# Patient Record
Sex: Female | Born: 1980
Health system: Southern US, Community
[De-identification: ages and names within clinical notes are randomized; demographics above are authoritative.]

## PROBLEM LIST (undated history)

## (undated) DIAGNOSIS — M25579 Pain in unspecified ankle and joints of unspecified foot: Secondary | ICD-10-CM

## (undated) DIAGNOSIS — I10 Essential (primary) hypertension: Secondary | ICD-10-CM

## (undated) DIAGNOSIS — F419 Anxiety disorder, unspecified: Secondary | ICD-10-CM

## (undated) DIAGNOSIS — Z889 Allergy status to unspecified drugs, medicaments and biological substances status: Secondary | ICD-10-CM

## (undated) DIAGNOSIS — L709 Acne, unspecified: Secondary | ICD-10-CM

## (undated) HISTORY — DX: Allergy status to unspecified drugs, medicaments and biological substances: Z88.9

## (undated) HISTORY — DX: Acne, unspecified: L70.9

## (undated) HISTORY — DX: Pain in unspecified ankle and joints of unspecified foot: M25.579

## (undated) HISTORY — DX: Anxiety disorder, unspecified: F41.9

## (undated) HISTORY — PX: ANKLE SURGERY: SHX546

---
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
## 2004-10-29 ENCOUNTER — Emergency Department (HOSPITAL_COMMUNITY): Admission: EM | Admit: 2004-10-29 | Discharge: 2004-10-30 | Payer: Self-pay | Admitting: Emergency Medicine

## 2007-04-16 ENCOUNTER — Other Ambulatory Visit: Admission: RE | Admit: 2007-04-16 | Discharge: 2007-04-16 | Payer: Self-pay | Admitting: Family Medicine

## 2008-08-10 ENCOUNTER — Emergency Department (HOSPITAL_COMMUNITY): Admission: EM | Admit: 2008-08-10 | Discharge: 2008-08-10 | Payer: Self-pay | Admitting: Emergency Medicine

## 2008-10-10 ENCOUNTER — Other Ambulatory Visit: Admission: RE | Admit: 2008-10-10 | Discharge: 2008-10-10 | Payer: Self-pay | Admitting: Family Medicine

## 2009-05-12 ENCOUNTER — Encounter: Admission: RE | Admit: 2009-05-12 | Discharge: 2009-05-12 | Payer: Self-pay | Admitting: Family Medicine

## 2010-09-09 LAB — URINE MICROSCOPIC-ADD ON

## 2010-09-09 LAB — URINALYSIS, ROUTINE W REFLEX MICROSCOPIC
Bilirubin Urine: NEGATIVE
Glucose, UA: NEGATIVE mg/dL
Hgb urine dipstick: NEGATIVE
Ketones, ur: NEGATIVE mg/dL
Nitrite: NEGATIVE
Protein, ur: 30 mg/dL — AB
Specific Gravity, Urine: 1.029 (ref 1.005–1.030)
Urobilinogen, UA: 1 mg/dL (ref 0.0–1.0)
pH: 7.5 (ref 5.0–8.0)

## 2010-09-09 LAB — POCT I-STAT, CHEM 8
BUN: 11 mg/dL (ref 6–23)
Calcium, Ion: 1.04 mmol/L — ABNORMAL LOW (ref 1.12–1.32)
Chloride: 106 mEq/L (ref 96–112)
Creatinine, Ser: 1.1 mg/dL (ref 0.4–1.2)
Glucose, Bld: 123 mg/dL — ABNORMAL HIGH (ref 70–99)
HCT: 33 % — ABNORMAL LOW (ref 36.0–46.0)
Hemoglobin: 11.2 g/dL — ABNORMAL LOW (ref 12.0–15.0)
Potassium: 4 mEq/L (ref 3.5–5.1)
Sodium: 138 mEq/L (ref 135–145)
TCO2: 25 mmol/L (ref 0–100)

## 2010-09-09 LAB — POCT PREGNANCY, URINE

## 2010-10-15 NOTE — Op Note (Signed)
[place]. [facility]  Patient:    Becky Tate, Becky Tate                       MRN: [id number] Proc. Date: 03/15/00 Adm. Date:  [id number] Attending:  Colbert Ewing                           Operative Report  PREOPERATIVE DIAGNOSIS:  Mild to moderate instability, left ankle with loose body secondary to osteochondritis desiccans/osteochondral fracture medial talar dome.  POSTOPERATIVE DIAGNOSIS:  Mild to moderate instability, left ankle with loose body secondary to osteochondritis desiccans/osteochondral fracture medial talar dome.  Loose avascular fragment irreparable.  Moderate laxity.  OPERATION PERFORMED:  Left ankle examination under anesthesia with stress views followed by arthroscopy, removal loose body, partial synovectomy and microfracture multiple drilling medial talar dome.  SURGEON:  Loreta Ave, M.D.  ASSISTANT:  Arlys John D. Petrarca, P.A.-C.  ANESTHESIA:  General.  ESTIMATED BLOOD LOSS:  Minimal.  TOURNIQUET TIME:  45 minutes.  SPECIMENS:  Excised loose body.  CULTURES:  None.  COMPLICATIONS:  None.  DRESSING:  Soft compressive with Cam walker.  DESCRIPTION OF PROCEDURE:  The patient was brought to the operating room and placed on the operating table in supine position.  After adequate anesthesia had been obtained, the left ankle had been examined.  Full motion. On stress view and talar tilt view, she opens up about 9 degrees to talar tilt.  The irregularity of the medial talar dome evident, but I did not see a bony loose fragment obvious.  Tourniquet applied, prepped and draped in the usual sterile fashion.  Exsanguinated with elevation and Esmarch.  Tourniquet inflated to 300 mmHg.  Anterolateral anteromedial arthroscopic portals.  Ankle entered with blunt obturator, distended and inspected.  Other than the osteochondral lesion which was 10 x 7 mm off the medial talar dome midportion, the remaining articular cartilage looked  excellent.  Reactive synovitis throughout the ankle all debrided.  The osteochondral lesion was also most completely free with only partial tethering with a sliver of articular cartilage anteriorly.  Not enough bone remaining in the fragment to warrant repair as this was completely avascular and mobile.  The fragment was freed up and removed with a grasping forceps.  All loose bodies debrided.  Uneven articular cartilage debrided to a stable surface.  The base of the lesion which had some mild fibrocartilage ingrowth was freshened with multiple drilling with the microfracturing device to allow vascularization for fibrocartilage ingrowth.  The entire ankle examined.  All loose fragments removed.  Instruments and fluid removed. Portals and ankle were injected with Marcaine. Portals closed with 4-0 nylon. Sterile compressive dressing applied.  Tourniquet deflated and removed. Anesthesia reversed.  Brought to recovery room.  Tolerated surgery well.  No complications. DD:  03/15/00 TD:  03/16/00 Job: [id number]

## 2011-05-25 ENCOUNTER — Other Ambulatory Visit: Payer: Self-pay | Admitting: Physician Assistant

## 2011-05-25 ENCOUNTER — Other Ambulatory Visit (HOSPITAL_COMMUNITY)
Admission: RE | Admit: 2011-05-25 | Discharge: 2011-05-25 | Disposition: A | Discharge status: Home / Self Care | Payer: 59 | Source: Ambulatory Visit | Attending: Family Medicine | Admitting: Family Medicine

## 2011-05-25 DIAGNOSIS — Z124 Encounter for screening for malignant neoplasm of cervix: Secondary | ICD-10-CM | POA: Insufficient documentation

## 2012-12-28 ENCOUNTER — Other Ambulatory Visit: Payer: Self-pay | Admitting: Physician Assistant

## 2012-12-28 ENCOUNTER — Other Ambulatory Visit (HOSPITAL_COMMUNITY)
Admission: RE | Admit: 2012-12-28 | Discharge: 2012-12-28 | Disposition: A | Discharge status: Home / Self Care | Payer: 59 | Source: Ambulatory Visit | Attending: Family Medicine | Admitting: Family Medicine

## 2012-12-28 DIAGNOSIS — Z124 Encounter for screening for malignant neoplasm of cervix: Secondary | ICD-10-CM | POA: Insufficient documentation

## 2014-07-17 ENCOUNTER — Other Ambulatory Visit (HOSPITAL_COMMUNITY)
Admission: RE | Admit: 2014-07-17 | Discharge: 2014-07-17 | Disposition: A | Discharge status: Home / Self Care | Payer: 59 | Source: Ambulatory Visit | Attending: Family Medicine | Admitting: Family Medicine

## 2014-07-17 ENCOUNTER — Other Ambulatory Visit: Payer: Self-pay | Admitting: Physician Assistant

## 2014-07-17 DIAGNOSIS — Z124 Encounter for screening for malignant neoplasm of cervix: Secondary | ICD-10-CM | POA: Diagnosis present

## 2014-07-18 LAB — CYTOLOGY - PAP

## 2015-11-09 DIAGNOSIS — L739 Follicular disorder, unspecified: Secondary | ICD-10-CM | POA: Diagnosis not present

## 2016-06-06 DIAGNOSIS — R42 Dizziness and giddiness: Secondary | ICD-10-CM | POA: Diagnosis not present

## 2016-06-06 DIAGNOSIS — R8290 Unspecified abnormal findings in urine: Secondary | ICD-10-CM | POA: Diagnosis not present

## 2016-06-06 DIAGNOSIS — M7710 Lateral epicondylitis, unspecified elbow: Secondary | ICD-10-CM | POA: Diagnosis not present

## 2016-06-10 DIAGNOSIS — H8123 Vestibular neuronitis, bilateral: Secondary | ICD-10-CM | POA: Diagnosis not present

## 2016-06-10 DIAGNOSIS — J300 Vasomotor rhinitis: Secondary | ICD-10-CM | POA: Diagnosis not present

## 2016-07-25 DIAGNOSIS — M170 Bilateral primary osteoarthritis of knee: Secondary | ICD-10-CM | POA: Diagnosis not present

## 2016-07-25 DIAGNOSIS — M1710 Unilateral primary osteoarthritis, unspecified knee: Secondary | ICD-10-CM

## 2016-07-25 DIAGNOSIS — M93272 Osteochondritis dissecans, left ankle and joints of left foot: Secondary | ICD-10-CM | POA: Diagnosis not present

## 2016-07-25 DIAGNOSIS — M179 Osteoarthritis of knee, unspecified: Secondary | ICD-10-CM | POA: Insufficient documentation

## 2016-07-25 DIAGNOSIS — M9320 Osteochondritis dissecans of unspecified site: Secondary | ICD-10-CM

## 2016-07-25 HISTORY — DX: Osteochondritis dissecans of unspecified site: M93.20

## 2016-07-25 HISTORY — DX: Osteoarthritis of knee, unspecified: M17.9

## 2016-07-25 HISTORY — DX: Unilateral primary osteoarthritis, unspecified knee: M17.10

## 2016-08-03 DIAGNOSIS — Z0000 Encounter for general adult medical examination without abnormal findings: Secondary | ICD-10-CM | POA: Diagnosis not present

## 2016-08-03 DIAGNOSIS — J3089 Other allergic rhinitis: Secondary | ICD-10-CM | POA: Diagnosis not present

## 2016-11-03 DIAGNOSIS — L638 Other alopecia areata: Secondary | ICD-10-CM | POA: Diagnosis not present

## 2016-11-03 DIAGNOSIS — L700 Acne vulgaris: Secondary | ICD-10-CM | POA: Diagnosis not present

## 2016-11-03 DIAGNOSIS — L218 Other seborrheic dermatitis: Secondary | ICD-10-CM | POA: Diagnosis not present

## 2016-12-08 DIAGNOSIS — F3289 Other specified depressive episodes: Secondary | ICD-10-CM | POA: Diagnosis not present

## 2016-12-16 DIAGNOSIS — G4719 Other hypersomnia: Secondary | ICD-10-CM | POA: Diagnosis not present

## 2016-12-16 DIAGNOSIS — R0683 Snoring: Secondary | ICD-10-CM | POA: Diagnosis not present

## 2016-12-20 DIAGNOSIS — F3289 Other specified depressive episodes: Secondary | ICD-10-CM | POA: Diagnosis not present

## 2016-12-29 DIAGNOSIS — J3489 Other specified disorders of nose and nasal sinuses: Secondary | ICD-10-CM | POA: Diagnosis not present

## 2017-01-04 DIAGNOSIS — F3289 Other specified depressive episodes: Secondary | ICD-10-CM | POA: Diagnosis not present

## 2017-01-08 DIAGNOSIS — G4733 Obstructive sleep apnea (adult) (pediatric): Secondary | ICD-10-CM | POA: Diagnosis not present

## 2017-01-09 DIAGNOSIS — G4733 Obstructive sleep apnea (adult) (pediatric): Secondary | ICD-10-CM | POA: Diagnosis not present

## 2017-01-20 DIAGNOSIS — G4733 Obstructive sleep apnea (adult) (pediatric): Secondary | ICD-10-CM | POA: Diagnosis not present

## 2017-02-06 DIAGNOSIS — F3289 Other specified depressive episodes: Secondary | ICD-10-CM | POA: Diagnosis not present

## 2017-02-20 DIAGNOSIS — G4733 Obstructive sleep apnea (adult) (pediatric): Secondary | ICD-10-CM | POA: Diagnosis not present

## 2017-03-02 DIAGNOSIS — B070 Plantar wart: Secondary | ICD-10-CM | POA: Diagnosis not present

## 2017-03-02 DIAGNOSIS — M71572 Other bursitis, not elsewhere classified, left ankle and foot: Secondary | ICD-10-CM | POA: Diagnosis not present

## 2017-03-08 DIAGNOSIS — F3289 Other specified depressive episodes: Secondary | ICD-10-CM | POA: Diagnosis not present

## 2017-03-16 DIAGNOSIS — I890 Lymphedema, not elsewhere classified: Secondary | ICD-10-CM | POA: Diagnosis not present

## 2017-03-16 DIAGNOSIS — B070 Plantar wart: Secondary | ICD-10-CM | POA: Diagnosis not present

## 2017-03-20 DIAGNOSIS — G4733 Obstructive sleep apnea (adult) (pediatric): Secondary | ICD-10-CM | POA: Diagnosis not present

## 2017-03-22 DIAGNOSIS — G4733 Obstructive sleep apnea (adult) (pediatric): Secondary | ICD-10-CM | POA: Diagnosis not present

## 2017-04-10 DIAGNOSIS — G4733 Obstructive sleep apnea (adult) (pediatric): Secondary | ICD-10-CM | POA: Diagnosis not present

## 2017-04-13 DIAGNOSIS — I890 Lymphedema, not elsewhere classified: Secondary | ICD-10-CM | POA: Diagnosis not present

## 2017-04-13 DIAGNOSIS — B070 Plantar wart: Secondary | ICD-10-CM | POA: Diagnosis not present

## 2017-04-15 DIAGNOSIS — J0100 Acute maxillary sinusitis, unspecified: Secondary | ICD-10-CM | POA: Diagnosis not present

## 2017-04-15 DIAGNOSIS — R42 Dizziness and giddiness: Secondary | ICD-10-CM | POA: Diagnosis not present

## 2017-04-15 DIAGNOSIS — I10 Essential (primary) hypertension: Secondary | ICD-10-CM | POA: Diagnosis not present

## 2017-04-17 DIAGNOSIS — F3289 Other specified depressive episodes: Secondary | ICD-10-CM | POA: Diagnosis not present

## 2017-04-22 DIAGNOSIS — G4733 Obstructive sleep apnea (adult) (pediatric): Secondary | ICD-10-CM | POA: Diagnosis not present

## 2017-04-24 DIAGNOSIS — R51 Headache: Secondary | ICD-10-CM | POA: Diagnosis not present

## 2017-05-17 DIAGNOSIS — F3289 Other specified depressive episodes: Secondary | ICD-10-CM | POA: Diagnosis not present

## 2017-05-22 DIAGNOSIS — G4733 Obstructive sleep apnea (adult) (pediatric): Secondary | ICD-10-CM | POA: Diagnosis not present

## 2017-05-24 DIAGNOSIS — G4733 Obstructive sleep apnea (adult) (pediatric): Secondary | ICD-10-CM | POA: Diagnosis not present

## 2017-06-09 DIAGNOSIS — R062 Wheezing: Secondary | ICD-10-CM | POA: Diagnosis not present

## 2017-06-09 DIAGNOSIS — R0981 Nasal congestion: Secondary | ICD-10-CM | POA: Diagnosis not present

## 2017-06-09 DIAGNOSIS — M25519 Pain in unspecified shoulder: Secondary | ICD-10-CM | POA: Diagnosis not present

## 2017-06-16 DIAGNOSIS — M25511 Pain in right shoulder: Secondary | ICD-10-CM | POA: Diagnosis not present

## 2017-06-16 DIAGNOSIS — F3289 Other specified depressive episodes: Secondary | ICD-10-CM | POA: Diagnosis not present

## 2017-06-22 DIAGNOSIS — G4733 Obstructive sleep apnea (adult) (pediatric): Secondary | ICD-10-CM | POA: Diagnosis not present

## 2017-07-13 DIAGNOSIS — F3289 Other specified depressive episodes: Secondary | ICD-10-CM | POA: Diagnosis not present

## 2017-07-23 DIAGNOSIS — G4733 Obstructive sleep apnea (adult) (pediatric): Secondary | ICD-10-CM | POA: Diagnosis not present

## 2017-07-31 DIAGNOSIS — F4321 Adjustment disorder with depressed mood: Secondary | ICD-10-CM | POA: Diagnosis not present

## 2017-08-02 DIAGNOSIS — F3289 Other specified depressive episodes: Secondary | ICD-10-CM | POA: Diagnosis not present

## 2017-08-16 DIAGNOSIS — F3289 Other specified depressive episodes: Secondary | ICD-10-CM | POA: Diagnosis not present

## 2017-08-20 DIAGNOSIS — G4733 Obstructive sleep apnea (adult) (pediatric): Secondary | ICD-10-CM | POA: Diagnosis not present

## 2017-08-24 DIAGNOSIS — Z13220 Encounter for screening for lipoid disorders: Secondary | ICD-10-CM | POA: Diagnosis not present

## 2017-08-24 DIAGNOSIS — Z0000 Encounter for general adult medical examination without abnormal findings: Secondary | ICD-10-CM | POA: Diagnosis not present

## 2017-08-24 DIAGNOSIS — F4321 Adjustment disorder with depressed mood: Secondary | ICD-10-CM | POA: Diagnosis not present

## 2017-08-24 DIAGNOSIS — R1110 Vomiting, unspecified: Secondary | ICD-10-CM | POA: Diagnosis not present

## 2017-08-24 DIAGNOSIS — F410 Panic disorder [episodic paroxysmal anxiety] without agoraphobia: Secondary | ICD-10-CM | POA: Diagnosis not present

## 2017-08-24 DIAGNOSIS — F430 Acute stress reaction: Secondary | ICD-10-CM | POA: Diagnosis not present

## 2017-09-11 DIAGNOSIS — K219 Gastro-esophageal reflux disease without esophagitis: Secondary | ICD-10-CM | POA: Diagnosis not present

## 2017-09-19 DIAGNOSIS — F3289 Other specified depressive episodes: Secondary | ICD-10-CM | POA: Diagnosis not present

## 2017-09-27 DIAGNOSIS — F430 Acute stress reaction: Secondary | ICD-10-CM | POA: Diagnosis not present

## 2017-09-27 DIAGNOSIS — F4321 Adjustment disorder with depressed mood: Secondary | ICD-10-CM | POA: Diagnosis not present

## 2017-10-03 DIAGNOSIS — R0789 Other chest pain: Secondary | ICD-10-CM | POA: Diagnosis not present

## 2017-10-03 DIAGNOSIS — R002 Palpitations: Secondary | ICD-10-CM | POA: Diagnosis not present

## 2017-10-03 DIAGNOSIS — F419 Anxiety disorder, unspecified: Secondary | ICD-10-CM | POA: Diagnosis not present

## 2017-10-11 DIAGNOSIS — F3289 Other specified depressive episodes: Secondary | ICD-10-CM | POA: Diagnosis not present

## 2017-10-24 ENCOUNTER — Ambulatory Visit: Payer: BLUE CROSS/BLUE SHIELD | Admitting: Cardiology

## 2017-10-24 ENCOUNTER — Encounter: Payer: Self-pay | Admitting: Cardiology

## 2017-10-24 DIAGNOSIS — R0789 Other chest pain: Secondary | ICD-10-CM | POA: Diagnosis not present

## 2017-10-24 DIAGNOSIS — H6980 Other specified disorders of Eustachian tube, unspecified ear: Secondary | ICD-10-CM | POA: Diagnosis not present

## 2017-10-24 DIAGNOSIS — R079 Chest pain, unspecified: Secondary | ICD-10-CM

## 2017-10-24 DIAGNOSIS — M26602 Left temporomandibular joint disorder, unspecified: Secondary | ICD-10-CM | POA: Diagnosis not present

## 2017-10-24 HISTORY — DX: Chest pain, unspecified: R07.9

## 2017-10-24 NOTE — Addendum Note (Signed)
Addended by: Craige Cotta on: 10/24/2017 03:47 PM   Modules accepted: Orders

## 2017-10-24 NOTE — Progress Notes (Signed)
Cardiology Office Note:    Date:  10/24/2017   ID:  Becky Tate, DOB 1981/02/09, MRN [id number]  PCP:  Milus Height, PA-C  Cardiologist:  Garwin Brothers, MD   Referring MD: No ref. provider found    ASSESSMENT:    1. Chest pain of uncertain etiology    PLAN:    In order of problems listed above:  1. I discussed my findings with the patient at extensive length.  I reassured about my findings.  Her chest pain is atypical for coronary etiology.  She hardly has any risk factors for coronary artery disease.  In view of her symptoms I will do a exercise treadmill stress test to reassure her.  Her blood pressure is stable.  Echocardiogram will be done to assess murmur heard on auscultation.  If these tests are negative I have asked her to embark on a fitness program which will help her diet better and lose weight also.  I think a portion of her symptoms are related to the grieving process at the loss of her dear one. 2. Patient will be seen in follow-up appointment in 3 months or earlier if the patient has any concerns    Medication Adjustments/Labs and Tests Ordered: Current medicines are reviewed at length with the patient today.  Concerns regarding medicines are outlined above.  No orders of the defined types were placed in this encounter.  No orders of the defined types were placed in this encounter.    History of Present Illness:    Becky Tate is a 37 y.o. female who is being seen today for the evaluation of chest pain.  Patient is a pleasant 37 year old female.  She is grieving from the loss of her mother.  This happened in February.  She mentions to me that she occasionally has chest pain stabbing in nature radiating to the left arm.  No orthopnea or PND.  With activities of daily living this chest pain is not reproduced.  She does not exercise on a regular basis.  No syncope.  At the time of my evaluation, the patient is alert awake oriented and in no distress.  Past  Medical History:  Diagnosis Date  . Acne   . Ankle pain   . Anxiety   . H/O seasonal allergies     Past Surgical History:  Procedure Laterality Date  . ANKLE SURGERY      Current Medications: Current Meds  Medication Sig  . albuterol (PROVENTIL HFA;VENTOLIN HFA) 108 (90 Base) MCG/ACT inhaler Inhale 2 puffs into the lungs every 4 (four) hours as needed.  Marland Kitchen buPROPion (WELLBUTRIN XL) 150 MG 24 hr tablet Take 1 tablet by mouth daily.  . fexofenadine (ALLEGRA) 180 MG tablet Take 180 mg by mouth daily.  . fluticasone (FLONASE) 50 MCG/ACT nasal spray Place 1 spray into the nose daily.  Marland Kitchen LORazepam (ATIVAN) 1 MG tablet Take 1 tablet by mouth as needed for anxiety. Can also take 1/2 tablet  . omeprazole (PRILOSEC) 40 MG capsule Take 40 mg by mouth daily.  . sertraline (ZOLOFT) 100 MG tablet Take 100 mg by mouth daily.  . [DISCONTINUED] hyoscyamine (LEVSIN, ANASPAZ) 0.125 MG tablet Take 1 tablet by mouth every 6 (six) hours as needed for cramping. Can take 1 to 2 tablets     Allergies:   Oxycodone-acetaminophen and Sulfa antibiotics   Social History   Socioeconomic History  . Marital status: Single    Spouse name: Not on file  . Number of children:  Not on file  . Years of education: Not on file  . Highest education level: Not on file  Occupational History  . Not on file  Social Needs  . Financial resource strain: Not on file  . Food insecurity:    Worry: Not on file    Inability: Not on file  . Transportation needs:    Medical: Not on file    Non-medical: Not on file  Tobacco Use  . Smoking status: Never Smoker  . Smokeless tobacco: Never Used  Substance and Sexual Activity  . Alcohol use: Not on file  . Drug use: Not on file  . Sexual activity: Not on file  Lifestyle  . Physical activity:    Days per week: Not on file    Minutes per session: Not on file  . Stress: Not on file  Relationships  . Social connections:    Talks on phone: Not on file    Gets together: Not  on file    Attends religious service: Not on file    Active member of club or organization: Not on file    Attends meetings of clubs or organizations: Not on file    Relationship status: Not on file  Other Topics Concern  . Not on file  Social History Narrative  . Not on file     Family History: The patient's family history includes Hypertension in her mother.  ROS:   Please see the history of present illness.    All other systems reviewed and are negative.  EKGs/Labs/Other Studies Reviewed:    The following studies were reviewed today: EKG reveals sinus rhythm and nonspecific ST-T changes next   Recent Labs: No results found for requested labs within last 8760 hours.  Recent Lipid Panel No results found for: CHOL, TRIG, HDL, CHOLHDL, VLDL, LDLCALC, LDLDIRECT  Physical Exam:    VS:  BP 126/82 (BP Location: Left Arm, Patient Position: Sitting, Cuff Size: Normal)   Pulse (!) 101   Ht  (1.702 m)   Wt 267 lb 1.9 oz (121.2 kg)   SpO2 98%   BMI 41.84 kg/m     Wt Readings from Last 3 Encounters:  10/24/17 267 lb 1.9 oz (121.2 kg)     GEN: Patient is in no acute distress HEENT: Normal NECK: No JVD; No carotid bruits LYMPHATICS: No lymphadenopathy CARDIAC: S1 S2 regular, 2/6 systolic murmur at the apex. RESPIRATORY:  Clear to auscultation without rales, wheezing or rhonchi  ABDOMEN: Soft, non-tender, non-distended MUSCULOSKELETAL:  No edema; No deformity  SKIN: Warm and dry NEUROLOGIC:  Alert and oriented x 3 PSYCHIATRIC:  Normal affect    Signed, Garwin Brothers, MD  10/24/2017 3:39 PM    [facility]

## 2017-10-24 NOTE — Patient Instructions (Signed)
Medication Instructions:  Your physician recommends that you continue on your current medications as directed. Please refer to the Current Medication list given to you today.  Labwork: None  Testing/Procedures: Your physician has requested that you have an exercise tolerance test. For further information please visit [email or web address] Please also follow instruction sheet, as given.  Your physician has requested that you have an echocardiogram. Echocardiography is a painless test that uses sound waves to create images of your heart. It provides your doctor with information about the size and shape of your heart and how well your heart's chambers and valves are working. This procedure takes approximately one hour. There are no restrictions for this procedure.  Follow-Up: Your physician recommends that you schedule a follow-up appointment in: 3 months  Any Other Special Instructions Will Be Listed Below (If Applicable).     If you need a refill on your cardiac medications before your next appointment, please call your pharmacy.   [facility]  Garey Ham, RN, BSN  Echocardiogram An echocardiogram, or echocardiography, uses sound waves (ultrasound) to produce an image of your heart. The echocardiogram is simple, painless, obtained within a short period of time, and offers valuable information to your health care provider. The images from an echocardiogram can provide information such as:  Evidence of coronary artery disease (CAD).  Heart size.  Heart muscle function.  Heart valve function.  Aneurysm detection.  Evidence of a past heart attack.  Fluid buildup around the heart.  Heart muscle thickening.  Assess heart valve function.  Tell a health care provider about:  Any allergies you have.  All medicines you are taking, including vitamins, herbs, eye drops, creams, and over-the-counter medicines.  Any problems you or family members have had with anesthetic  medicines.  Any blood disorders you have.  Any surgeries you have had.  Any medical conditions you have.  Whether you are pregnant or may be pregnant. What happens before the procedure? No special preparation is needed. Eat and drink normally. What happens during the procedure?  In order to produce an image of your heart, gel will be applied to your chest and a wand-like tool (transducer) will be moved over your chest. The gel will help transmit the sound waves from the transducer. The sound waves will harmlessly bounce off your heart to allow the heart images to be captured in real-time motion. These images will then be recorded.  You may need an IV to receive a medicine that improves the quality of the pictures. What happens after the procedure? You may return to your normal schedule including diet, activities, and medicines, unless your health care provider tells you otherwise. This information is not intended to replace advice given to you by your health care provider. Make sure you discuss any questions you have with your health care provider. Document Released: 05/13/2000 Document Revised: 01/02/2016 Document Reviewed: 01/21/2013 Elsevier Interactive Patient Education  2017 Elsevier Inc.  Cardiopulmonary Exercise Stress Test Cardiopulmonary exercise testing (CPET) is a test that checks how your heart and lungs react to exercise. This is called your exercise capacity. During this test, you will walk or run on a treadmill or pedal on a stationary bike while tests are done on your heart and lungs. You may have this test to:  See why you are short of breath.  Check for exercise intolerance.  See how your lungs work.  See how your heart works.  Check for how you are responding to a heart or lung  rehabilitation program.  See if you have a heart or lung problem.  See if you are healthy enough to have surgery.  What happens before the procedure?  Follow instructions from your  doctor about what you cannot eat or drink.  Ask your doctor about changing or stopping your normal medicines. This is important if you take diabetes medicines or blood thinners.  Wear loose, comfortable clothing and shoes.  If you use an inhaler, bring it with you to the test. What happens during the procedure?  A blood pressure cuff will be placed on your arm.  Several stick-on patches (electrodes) will be placed on your chest. They will be attached to an electrocardiogram (EKG) machine.  A clip-on monitor that measures the amount of oxygen in your blood will be placed on your finger (pulse oximeter).  A clip will be placed on your nose and a mouthpiece will be placed in your mouth. This may be held in place with a headpiece. You will breathe through the mouthpiece during the test.  You will be asked to start exercising. You will be closely watched while you exercise.  The amount of effort for your exercise will be gradually increased.  During exercise, the test will measure: ? Your heart rate. ? Your heart rhythm. ? Your oxygen blood level. ? The amount of oxygen and carbon dioxide that you breathe out.  The test will end when: ? You have finished the test. ? You have reached your maximum ability to exercise. ? You have chest or leg pain, dizziness, or shortness of breath. The procedure may vary among doctors and hospitals. What happens after the procedure?  Your blood pressure and EKG will be checked to watch how you recover from the test. This information is not intended to replace advice given to you by your health care provider. Make sure you discuss any questions you have with your health care provider. Document Released: 05/04/2009 Document Revised: 10/06/2015 Document Reviewed: 03/30/2015 Elsevier Interactive Patient Education  2018 [employer]

## 2017-10-31 ENCOUNTER — Telehealth: Payer: Self-pay

## 2017-10-31 ENCOUNTER — Other Ambulatory Visit: Payer: Self-pay

## 2017-10-31 ENCOUNTER — Ambulatory Visit (INDEPENDENT_AMBULATORY_CARE_PROVIDER_SITE_OTHER): Payer: BLUE CROSS/BLUE SHIELD

## 2017-10-31 ENCOUNTER — Ambulatory Visit (HOSPITAL_COMMUNITY): Payer: BLUE CROSS/BLUE SHIELD | Attending: Cardiology

## 2017-10-31 DIAGNOSIS — F430 Acute stress reaction: Secondary | ICD-10-CM | POA: Diagnosis not present

## 2017-10-31 DIAGNOSIS — R0789 Other chest pain: Secondary | ICD-10-CM

## 2017-10-31 DIAGNOSIS — F4321 Adjustment disorder with depressed mood: Secondary | ICD-10-CM | POA: Diagnosis not present

## 2017-10-31 DIAGNOSIS — I517 Cardiomegaly: Secondary | ICD-10-CM | POA: Insufficient documentation

## 2017-10-31 DIAGNOSIS — R079 Chest pain, unspecified: Secondary | ICD-10-CM

## 2017-10-31 DIAGNOSIS — F3289 Other specified depressive episodes: Secondary | ICD-10-CM | POA: Diagnosis not present

## 2017-10-31 DIAGNOSIS — J3489 Other specified disorders of nose and nasal sinuses: Secondary | ICD-10-CM | POA: Diagnosis not present

## 2017-10-31 NOTE — Telephone Encounter (Signed)
Patient was called and notified of her results.

## 2017-11-01 LAB — EXERCISE TOLERANCE TEST
Estimated workload: 7.9 METS
Exercise duration (min): 6 min
Exercise duration (sec): 36 s
MPHR: 184 {beats}/min
Peak HR: 176 {beats}/min
Percent HR: 95 %
RPE: 17
Rest HR: 97 {beats}/min

## 2017-11-08 DIAGNOSIS — F3289 Other specified depressive episodes: Secondary | ICD-10-CM | POA: Diagnosis not present

## 2017-11-22 DIAGNOSIS — G4733 Obstructive sleep apnea (adult) (pediatric): Secondary | ICD-10-CM | POA: Diagnosis not present

## 2017-12-07 DIAGNOSIS — F3289 Other specified depressive episodes: Secondary | ICD-10-CM | POA: Diagnosis not present

## 2017-12-12 DIAGNOSIS — N62 Hypertrophy of breast: Secondary | ICD-10-CM | POA: Diagnosis not present

## 2017-12-12 DIAGNOSIS — N649 Disorder of breast, unspecified: Secondary | ICD-10-CM | POA: Diagnosis not present

## 2017-12-25 DIAGNOSIS — G479 Sleep disorder, unspecified: Secondary | ICD-10-CM | POA: Diagnosis not present

## 2017-12-25 DIAGNOSIS — M25511 Pain in right shoulder: Secondary | ICD-10-CM | POA: Diagnosis not present

## 2017-12-25 DIAGNOSIS — L819 Disorder of pigmentation, unspecified: Secondary | ICD-10-CM | POA: Diagnosis not present

## 2017-12-25 DIAGNOSIS — M542 Cervicalgia: Secondary | ICD-10-CM | POA: Diagnosis not present

## 2017-12-28 DIAGNOSIS — F3289 Other specified depressive episodes: Secondary | ICD-10-CM | POA: Diagnosis not present

## 2018-01-10 DIAGNOSIS — N62 Hypertrophy of breast: Secondary | ICD-10-CM | POA: Diagnosis not present

## 2018-01-10 DIAGNOSIS — M542 Cervicalgia: Secondary | ICD-10-CM | POA: Diagnosis not present

## 2018-01-10 DIAGNOSIS — M546 Pain in thoracic spine: Secondary | ICD-10-CM | POA: Diagnosis not present

## 2018-01-11 DIAGNOSIS — J329 Chronic sinusitis, unspecified: Secondary | ICD-10-CM | POA: Diagnosis not present

## 2018-01-11 DIAGNOSIS — M25519 Pain in unspecified shoulder: Secondary | ICD-10-CM | POA: Diagnosis not present

## 2018-01-11 DIAGNOSIS — M542 Cervicalgia: Secondary | ICD-10-CM | POA: Diagnosis not present

## 2018-01-17 DIAGNOSIS — M9901 Segmental and somatic dysfunction of cervical region: Secondary | ICD-10-CM | POA: Diagnosis not present

## 2018-01-17 DIAGNOSIS — M40292 Other kyphosis, cervical region: Secondary | ICD-10-CM | POA: Diagnosis not present

## 2018-01-17 DIAGNOSIS — R293 Abnormal posture: Secondary | ICD-10-CM | POA: Diagnosis not present

## 2018-01-17 DIAGNOSIS — M21752 Unequal limb length (acquired), left femur: Secondary | ICD-10-CM | POA: Diagnosis not present

## 2018-01-19 DIAGNOSIS — M545 Low back pain: Secondary | ICD-10-CM | POA: Diagnosis not present

## 2018-01-19 DIAGNOSIS — M542 Cervicalgia: Secondary | ICD-10-CM | POA: Diagnosis not present

## 2018-01-22 DIAGNOSIS — M21752 Unequal limb length (acquired), left femur: Secondary | ICD-10-CM | POA: Diagnosis not present

## 2018-01-22 DIAGNOSIS — R293 Abnormal posture: Secondary | ICD-10-CM | POA: Diagnosis not present

## 2018-01-22 DIAGNOSIS — M40292 Other kyphosis, cervical region: Secondary | ICD-10-CM | POA: Diagnosis not present

## 2018-01-22 DIAGNOSIS — M9901 Segmental and somatic dysfunction of cervical region: Secondary | ICD-10-CM | POA: Diagnosis not present

## 2018-01-24 DIAGNOSIS — M21752 Unequal limb length (acquired), left femur: Secondary | ICD-10-CM | POA: Diagnosis not present

## 2018-01-24 DIAGNOSIS — R293 Abnormal posture: Secondary | ICD-10-CM | POA: Diagnosis not present

## 2018-01-24 DIAGNOSIS — M40292 Other kyphosis, cervical region: Secondary | ICD-10-CM | POA: Diagnosis not present

## 2018-01-24 DIAGNOSIS — M9901 Segmental and somatic dysfunction of cervical region: Secondary | ICD-10-CM | POA: Diagnosis not present

## 2018-01-24 DIAGNOSIS — F3289 Other specified depressive episodes: Secondary | ICD-10-CM | POA: Diagnosis not present

## 2018-01-26 DIAGNOSIS — M545 Low back pain: Secondary | ICD-10-CM | POA: Diagnosis not present

## 2018-01-26 DIAGNOSIS — M542 Cervicalgia: Secondary | ICD-10-CM | POA: Diagnosis not present

## 2018-02-01 DIAGNOSIS — M9901 Segmental and somatic dysfunction of cervical region: Secondary | ICD-10-CM | POA: Diagnosis not present

## 2018-02-01 DIAGNOSIS — R293 Abnormal posture: Secondary | ICD-10-CM | POA: Diagnosis not present

## 2018-02-01 DIAGNOSIS — M21752 Unequal limb length (acquired), left femur: Secondary | ICD-10-CM | POA: Diagnosis not present

## 2018-02-01 DIAGNOSIS — M40292 Other kyphosis, cervical region: Secondary | ICD-10-CM | POA: Diagnosis not present

## 2018-02-07 DIAGNOSIS — F419 Anxiety disorder, unspecified: Secondary | ICD-10-CM | POA: Diagnosis not present

## 2018-02-07 DIAGNOSIS — F4321 Adjustment disorder with depressed mood: Secondary | ICD-10-CM | POA: Diagnosis not present

## 2018-02-08 DIAGNOSIS — M545 Low back pain: Secondary | ICD-10-CM | POA: Diagnosis not present

## 2018-02-08 DIAGNOSIS — M542 Cervicalgia: Secondary | ICD-10-CM | POA: Diagnosis not present

## 2018-02-12 DIAGNOSIS — R293 Abnormal posture: Secondary | ICD-10-CM | POA: Diagnosis not present

## 2018-02-12 DIAGNOSIS — M9901 Segmental and somatic dysfunction of cervical region: Secondary | ICD-10-CM | POA: Diagnosis not present

## 2018-02-12 DIAGNOSIS — M40292 Other kyphosis, cervical region: Secondary | ICD-10-CM | POA: Diagnosis not present

## 2018-02-12 DIAGNOSIS — M21752 Unequal limb length (acquired), left femur: Secondary | ICD-10-CM | POA: Diagnosis not present

## 2018-02-14 DIAGNOSIS — M9901 Segmental and somatic dysfunction of cervical region: Secondary | ICD-10-CM | POA: Diagnosis not present

## 2018-02-14 DIAGNOSIS — R293 Abnormal posture: Secondary | ICD-10-CM | POA: Diagnosis not present

## 2018-02-14 DIAGNOSIS — M542 Cervicalgia: Secondary | ICD-10-CM | POA: Diagnosis not present

## 2018-02-14 DIAGNOSIS — M40292 Other kyphosis, cervical region: Secondary | ICD-10-CM | POA: Diagnosis not present

## 2018-02-14 DIAGNOSIS — M21752 Unequal limb length (acquired), left femur: Secondary | ICD-10-CM | POA: Diagnosis not present

## 2018-02-14 DIAGNOSIS — M545 Low back pain: Secondary | ICD-10-CM | POA: Diagnosis not present

## 2018-02-15 DIAGNOSIS — N62 Hypertrophy of breast: Secondary | ICD-10-CM | POA: Diagnosis not present

## 2018-02-21 DIAGNOSIS — M21752 Unequal limb length (acquired), left femur: Secondary | ICD-10-CM | POA: Diagnosis not present

## 2018-02-21 DIAGNOSIS — F3289 Other specified depressive episodes: Secondary | ICD-10-CM | POA: Diagnosis not present

## 2018-02-21 DIAGNOSIS — R293 Abnormal posture: Secondary | ICD-10-CM | POA: Diagnosis not present

## 2018-02-21 DIAGNOSIS — M9901 Segmental and somatic dysfunction of cervical region: Secondary | ICD-10-CM | POA: Diagnosis not present

## 2018-02-21 DIAGNOSIS — M40292 Other kyphosis, cervical region: Secondary | ICD-10-CM | POA: Diagnosis not present

## 2018-02-23 DIAGNOSIS — R293 Abnormal posture: Secondary | ICD-10-CM | POA: Diagnosis not present

## 2018-02-23 DIAGNOSIS — M9901 Segmental and somatic dysfunction of cervical region: Secondary | ICD-10-CM | POA: Diagnosis not present

## 2018-02-23 DIAGNOSIS — M40292 Other kyphosis, cervical region: Secondary | ICD-10-CM | POA: Diagnosis not present

## 2018-02-23 DIAGNOSIS — M21752 Unequal limb length (acquired), left femur: Secondary | ICD-10-CM | POA: Diagnosis not present

## 2018-03-02 DIAGNOSIS — M40292 Other kyphosis, cervical region: Secondary | ICD-10-CM | POA: Diagnosis not present

## 2018-03-02 DIAGNOSIS — R293 Abnormal posture: Secondary | ICD-10-CM | POA: Diagnosis not present

## 2018-03-02 DIAGNOSIS — M21752 Unequal limb length (acquired), left femur: Secondary | ICD-10-CM | POA: Diagnosis not present

## 2018-03-02 DIAGNOSIS — M9901 Segmental and somatic dysfunction of cervical region: Secondary | ICD-10-CM | POA: Diagnosis not present

## 2018-03-08 DIAGNOSIS — R635 Abnormal weight gain: Secondary | ICD-10-CM | POA: Diagnosis not present

## 2018-03-12 DIAGNOSIS — F3289 Other specified depressive episodes: Secondary | ICD-10-CM | POA: Diagnosis not present

## 2018-03-15 DIAGNOSIS — G4733 Obstructive sleep apnea (adult) (pediatric): Secondary | ICD-10-CM | POA: Diagnosis not present

## 2018-03-15 DIAGNOSIS — E8881 Metabolic syndrome: Secondary | ICD-10-CM | POA: Diagnosis not present

## 2018-03-15 DIAGNOSIS — Z6841 Body Mass Index (BMI) 40.0 and over, adult: Secondary | ICD-10-CM | POA: Diagnosis not present

## 2018-03-16 DIAGNOSIS — Z6838 Body mass index (BMI) 38.0-38.9, adult: Secondary | ICD-10-CM

## 2018-03-16 DIAGNOSIS — E781 Pure hyperglyceridemia: Secondary | ICD-10-CM | POA: Insufficient documentation

## 2018-03-16 DIAGNOSIS — E8881 Metabolic syndrome: Secondary | ICD-10-CM

## 2018-03-16 HISTORY — DX: Body mass index (BMI) 38.0-38.9, adult: Z68.38

## 2018-03-16 HISTORY — DX: Pure hyperglyceridemia: E78.1

## 2018-03-16 HISTORY — DX: Metabolic syndrome: E88.81

## 2018-04-09 DIAGNOSIS — Z713 Dietary counseling and surveillance: Secondary | ICD-10-CM | POA: Diagnosis not present

## 2018-04-09 DIAGNOSIS — Z6841 Body Mass Index (BMI) 40.0 and over, adult: Secondary | ICD-10-CM | POA: Diagnosis not present

## 2018-04-11 DIAGNOSIS — G4733 Obstructive sleep apnea (adult) (pediatric): Secondary | ICD-10-CM | POA: Diagnosis not present

## 2018-04-11 DIAGNOSIS — R030 Elevated blood-pressure reading, without diagnosis of hypertension: Secondary | ICD-10-CM | POA: Diagnosis not present

## 2018-04-12 DIAGNOSIS — J300 Vasomotor rhinitis: Secondary | ICD-10-CM | POA: Diagnosis not present

## 2018-04-12 DIAGNOSIS — J329 Chronic sinusitis, unspecified: Secondary | ICD-10-CM | POA: Diagnosis not present

## 2018-04-12 DIAGNOSIS — F3289 Other specified depressive episodes: Secondary | ICD-10-CM | POA: Diagnosis not present

## 2018-04-17 ENCOUNTER — Other Ambulatory Visit: Payer: Self-pay | Admitting: Otolaryngology

## 2018-04-17 DIAGNOSIS — J329 Chronic sinusitis, unspecified: Secondary | ICD-10-CM

## 2018-04-24 DIAGNOSIS — Z6841 Body Mass Index (BMI) 40.0 and over, adult: Secondary | ICD-10-CM | POA: Diagnosis not present

## 2018-04-24 DIAGNOSIS — E781 Pure hyperglyceridemia: Secondary | ICD-10-CM | POA: Diagnosis not present

## 2018-04-24 DIAGNOSIS — E8881 Metabolic syndrome: Secondary | ICD-10-CM | POA: Diagnosis not present

## 2018-04-30 DIAGNOSIS — F3289 Other specified depressive episodes: Secondary | ICD-10-CM | POA: Diagnosis not present

## 2018-05-05 ENCOUNTER — Ambulatory Visit
Admission: RE | Admit: 2018-05-05 | Discharge: 2018-05-05 | Disposition: A | Discharge status: Home / Self Care | Payer: BLUE CROSS/BLUE SHIELD | Source: Ambulatory Visit | Attending: Otolaryngology | Admitting: Otolaryngology

## 2018-05-05 DIAGNOSIS — J329 Chronic sinusitis, unspecified: Secondary | ICD-10-CM | POA: Diagnosis not present

## 2018-05-05 DIAGNOSIS — J342 Deviated nasal septum: Secondary | ICD-10-CM | POA: Diagnosis not present

## 2018-05-09 DIAGNOSIS — G4733 Obstructive sleep apnea (adult) (pediatric): Secondary | ICD-10-CM | POA: Diagnosis not present

## 2018-05-16 DIAGNOSIS — F3289 Other specified depressive episodes: Secondary | ICD-10-CM | POA: Diagnosis not present

## 2018-05-16 DIAGNOSIS — J310 Chronic rhinitis: Secondary | ICD-10-CM | POA: Diagnosis not present

## 2018-05-16 DIAGNOSIS — G4733 Obstructive sleep apnea (adult) (pediatric): Secondary | ICD-10-CM | POA: Diagnosis not present

## 2018-05-16 DIAGNOSIS — F515 Nightmare disorder: Secondary | ICD-10-CM | POA: Diagnosis not present

## 2018-05-22 DIAGNOSIS — K625 Hemorrhage of anus and rectum: Secondary | ICD-10-CM | POA: Diagnosis not present

## 2018-05-22 DIAGNOSIS — F322 Major depressive disorder, single episode, severe without psychotic features: Secondary | ICD-10-CM | POA: Diagnosis not present

## 2018-05-22 DIAGNOSIS — F419 Anxiety disorder, unspecified: Secondary | ICD-10-CM | POA: Diagnosis not present

## 2018-05-22 DIAGNOSIS — R5383 Other fatigue: Secondary | ICD-10-CM | POA: Diagnosis not present

## 2018-05-22 DIAGNOSIS — K5900 Constipation, unspecified: Secondary | ICD-10-CM | POA: Diagnosis not present

## 2018-06-05 DIAGNOSIS — F3289 Other specified depressive episodes: Secondary | ICD-10-CM | POA: Diagnosis not present

## 2018-06-20 DIAGNOSIS — F3289 Other specified depressive episodes: Secondary | ICD-10-CM | POA: Diagnosis not present

## 2018-07-05 DIAGNOSIS — Z713 Dietary counseling and surveillance: Secondary | ICD-10-CM | POA: Diagnosis not present

## 2018-07-05 DIAGNOSIS — Z6841 Body Mass Index (BMI) 40.0 and over, adult: Secondary | ICD-10-CM | POA: Diagnosis not present

## 2018-07-05 DIAGNOSIS — F3289 Other specified depressive episodes: Secondary | ICD-10-CM | POA: Diagnosis not present

## 2018-07-16 DIAGNOSIS — G4733 Obstructive sleep apnea (adult) (pediatric): Secondary | ICD-10-CM | POA: Diagnosis not present

## 2018-07-17 DIAGNOSIS — Z713 Dietary counseling and surveillance: Secondary | ICD-10-CM | POA: Diagnosis not present

## 2018-07-17 DIAGNOSIS — E8881 Metabolic syndrome: Secondary | ICD-10-CM | POA: Diagnosis not present

## 2018-07-17 DIAGNOSIS — Z6841 Body Mass Index (BMI) 40.0 and over, adult: Secondary | ICD-10-CM | POA: Diagnosis not present

## 2018-07-24 DIAGNOSIS — F3289 Other specified depressive episodes: Secondary | ICD-10-CM | POA: Diagnosis not present

## 2018-07-30 DIAGNOSIS — B349 Viral infection, unspecified: Secondary | ICD-10-CM | POA: Diagnosis not present

## 2018-08-06 DIAGNOSIS — Z713 Dietary counseling and surveillance: Secondary | ICD-10-CM | POA: Diagnosis not present

## 2018-08-06 DIAGNOSIS — E669 Obesity, unspecified: Secondary | ICD-10-CM | POA: Diagnosis not present

## 2018-08-06 DIAGNOSIS — Z6839 Body mass index (BMI) 39.0-39.9, adult: Secondary | ICD-10-CM | POA: Diagnosis not present

## 2018-08-08 DIAGNOSIS — F419 Anxiety disorder, unspecified: Secondary | ICD-10-CM | POA: Diagnosis not present

## 2018-08-08 DIAGNOSIS — F322 Major depressive disorder, single episode, severe without psychotic features: Secondary | ICD-10-CM | POA: Diagnosis not present

## 2018-08-14 DIAGNOSIS — Z6841 Body Mass Index (BMI) 40.0 and over, adult: Secondary | ICD-10-CM | POA: Diagnosis not present

## 2018-08-14 DIAGNOSIS — Z713 Dietary counseling and surveillance: Secondary | ICD-10-CM | POA: Diagnosis not present

## 2018-08-14 DIAGNOSIS — E8881 Metabolic syndrome: Secondary | ICD-10-CM | POA: Diagnosis not present

## 2018-08-28 DIAGNOSIS — M2550 Pain in unspecified joint: Secondary | ICD-10-CM | POA: Diagnosis not present

## 2018-08-28 DIAGNOSIS — M791 Myalgia, unspecified site: Secondary | ICD-10-CM | POA: Diagnosis not present

## 2018-08-28 DIAGNOSIS — R200 Anesthesia of skin: Secondary | ICD-10-CM | POA: Diagnosis not present

## 2018-08-28 DIAGNOSIS — G94 Other disorders of brain in diseases classified elsewhere: Secondary | ICD-10-CM | POA: Diagnosis not present

## 2018-09-05 DIAGNOSIS — F3289 Other specified depressive episodes: Secondary | ICD-10-CM | POA: Diagnosis not present

## 2018-09-26 DIAGNOSIS — F3289 Other specified depressive episodes: Secondary | ICD-10-CM | POA: Diagnosis not present

## 2018-09-27 DIAGNOSIS — N62 Hypertrophy of breast: Secondary | ICD-10-CM | POA: Diagnosis not present

## 2018-10-06 DIAGNOSIS — J029 Acute pharyngitis, unspecified: Secondary | ICD-10-CM | POA: Diagnosis not present

## 2018-10-06 DIAGNOSIS — R030 Elevated blood-pressure reading, without diagnosis of hypertension: Secondary | ICD-10-CM | POA: Diagnosis not present

## 2018-10-11 DIAGNOSIS — R635 Abnormal weight gain: Secondary | ICD-10-CM | POA: Diagnosis not present

## 2018-10-11 DIAGNOSIS — E8881 Metabolic syndrome: Secondary | ICD-10-CM | POA: Diagnosis not present

## 2018-10-11 DIAGNOSIS — Z713 Dietary counseling and surveillance: Secondary | ICD-10-CM | POA: Diagnosis not present

## 2018-10-11 DIAGNOSIS — E669 Obesity, unspecified: Secondary | ICD-10-CM | POA: Diagnosis not present

## 2018-10-11 DIAGNOSIS — F3289 Other specified depressive episodes: Secondary | ICD-10-CM | POA: Diagnosis not present

## 2018-10-25 DIAGNOSIS — I10 Essential (primary) hypertension: Secondary | ICD-10-CM | POA: Diagnosis not present

## 2018-10-26 DIAGNOSIS — G4733 Obstructive sleep apnea (adult) (pediatric): Secondary | ICD-10-CM | POA: Diagnosis not present

## 2018-10-26 DIAGNOSIS — I10 Essential (primary) hypertension: Secondary | ICD-10-CM | POA: Diagnosis not present

## 2018-10-26 DIAGNOSIS — E669 Obesity, unspecified: Secondary | ICD-10-CM | POA: Diagnosis not present

## 2018-10-26 DIAGNOSIS — Z713 Dietary counseling and surveillance: Secondary | ICD-10-CM | POA: Diagnosis not present

## 2018-11-02 DIAGNOSIS — G4733 Obstructive sleep apnea (adult) (pediatric): Secondary | ICD-10-CM | POA: Diagnosis not present

## 2018-11-05 DIAGNOSIS — F3289 Other specified depressive episodes: Secondary | ICD-10-CM | POA: Diagnosis not present

## 2018-11-16 DIAGNOSIS — G4733 Obstructive sleep apnea (adult) (pediatric): Secondary | ICD-10-CM | POA: Diagnosis not present

## 2018-11-19 DIAGNOSIS — G4733 Obstructive sleep apnea (adult) (pediatric): Secondary | ICD-10-CM | POA: Diagnosis not present

## 2018-11-21 DIAGNOSIS — D485 Neoplasm of uncertain behavior of skin: Secondary | ICD-10-CM | POA: Diagnosis not present

## 2018-11-27 DIAGNOSIS — F3289 Other specified depressive episodes: Secondary | ICD-10-CM | POA: Diagnosis not present

## 2018-12-12 DIAGNOSIS — E8881 Metabolic syndrome: Secondary | ICD-10-CM | POA: Diagnosis not present

## 2018-12-12 DIAGNOSIS — Z6838 Body mass index (BMI) 38.0-38.9, adult: Secondary | ICD-10-CM | POA: Diagnosis not present

## 2018-12-12 DIAGNOSIS — E871 Hypo-osmolality and hyponatremia: Secondary | ICD-10-CM | POA: Diagnosis not present

## 2018-12-12 DIAGNOSIS — E669 Obesity, unspecified: Secondary | ICD-10-CM | POA: Diagnosis not present

## 2018-12-12 DIAGNOSIS — E785 Hyperlipidemia, unspecified: Secondary | ICD-10-CM | POA: Diagnosis not present

## 2018-12-12 DIAGNOSIS — E781 Pure hyperglyceridemia: Secondary | ICD-10-CM | POA: Diagnosis not present

## 2019-01-04 DIAGNOSIS — F3289 Other specified depressive episodes: Secondary | ICD-10-CM | POA: Diagnosis not present

## 2019-01-30 DIAGNOSIS — J329 Chronic sinusitis, unspecified: Secondary | ICD-10-CM | POA: Diagnosis not present

## 2019-01-30 DIAGNOSIS — Z6838 Body mass index (BMI) 38.0-38.9, adult: Secondary | ICD-10-CM | POA: Diagnosis not present

## 2019-01-30 DIAGNOSIS — F419 Anxiety disorder, unspecified: Secondary | ICD-10-CM | POA: Diagnosis not present

## 2019-01-30 DIAGNOSIS — R635 Abnormal weight gain: Secondary | ICD-10-CM | POA: Diagnosis not present

## 2019-01-30 DIAGNOSIS — F322 Major depressive disorder, single episode, severe without psychotic features: Secondary | ICD-10-CM | POA: Diagnosis not present

## 2019-01-30 DIAGNOSIS — I10 Essential (primary) hypertension: Secondary | ICD-10-CM | POA: Diagnosis not present

## 2019-01-30 DIAGNOSIS — Z713 Dietary counseling and surveillance: Secondary | ICD-10-CM | POA: Diagnosis not present

## 2019-02-15 DIAGNOSIS — G4733 Obstructive sleep apnea (adult) (pediatric): Secondary | ICD-10-CM | POA: Diagnosis not present

## 2019-02-21 DIAGNOSIS — F3289 Other specified depressive episodes: Secondary | ICD-10-CM | POA: Diagnosis not present

## 2019-03-18 DIAGNOSIS — Z713 Dietary counseling and surveillance: Secondary | ICD-10-CM | POA: Diagnosis not present

## 2019-03-18 DIAGNOSIS — Z6839 Body mass index (BMI) 39.0-39.9, adult: Secondary | ICD-10-CM | POA: Diagnosis not present

## 2019-03-18 DIAGNOSIS — E669 Obesity, unspecified: Secondary | ICD-10-CM | POA: Diagnosis not present

## 2019-04-04 DIAGNOSIS — F322 Major depressive disorder, single episode, severe without psychotic features: Secondary | ICD-10-CM | POA: Diagnosis not present

## 2019-04-04 DIAGNOSIS — Z0000 Encounter for general adult medical examination without abnormal findings: Secondary | ICD-10-CM | POA: Diagnosis not present

## 2019-04-04 DIAGNOSIS — F419 Anxiety disorder, unspecified: Secondary | ICD-10-CM | POA: Diagnosis not present

## 2019-04-04 DIAGNOSIS — Z13220 Encounter for screening for lipoid disorders: Secondary | ICD-10-CM | POA: Diagnosis not present

## 2019-04-04 DIAGNOSIS — I10 Essential (primary) hypertension: Secondary | ICD-10-CM | POA: Diagnosis not present

## 2019-04-04 DIAGNOSIS — Z23 Encounter for immunization: Secondary | ICD-10-CM | POA: Diagnosis not present

## 2019-04-10 DIAGNOSIS — G4733 Obstructive sleep apnea (adult) (pediatric): Secondary | ICD-10-CM | POA: Diagnosis not present

## 2019-04-15 ENCOUNTER — Other Ambulatory Visit (HOSPITAL_COMMUNITY)
Admission: RE | Admit: 2019-04-15 | Discharge: 2019-04-15 | Disposition: A | Discharge status: Home / Self Care | Payer: BC Managed Care – PPO | Source: Ambulatory Visit | Attending: Nurse Practitioner | Admitting: Nurse Practitioner

## 2019-04-15 ENCOUNTER — Other Ambulatory Visit: Payer: Self-pay | Admitting: Nurse Practitioner

## 2019-04-15 DIAGNOSIS — Z01419 Encounter for gynecological examination (general) (routine) without abnormal findings: Secondary | ICD-10-CM | POA: Diagnosis not present

## 2019-04-15 DIAGNOSIS — Z124 Encounter for screening for malignant neoplasm of cervix: Secondary | ICD-10-CM | POA: Diagnosis not present

## 2019-04-15 DIAGNOSIS — N920 Excessive and frequent menstruation with regular cycle: Secondary | ICD-10-CM | POA: Diagnosis not present

## 2019-04-17 LAB — CYTOLOGY - PAP
Comment: NEGATIVE
Diagnosis: NEGATIVE
High risk HPV: NEGATIVE

## 2019-04-19 DIAGNOSIS — N62 Hypertrophy of breast: Secondary | ICD-10-CM | POA: Diagnosis not present

## 2019-04-22 DIAGNOSIS — R102 Pelvic and perineal pain: Secondary | ICD-10-CM | POA: Diagnosis not present

## 2019-04-30 HISTORY — PX: BREAST REDUCTION SURGERY: SHX8

## 2019-05-02 DIAGNOSIS — Z1159 Encounter for screening for other viral diseases: Secondary | ICD-10-CM | POA: Diagnosis not present

## 2019-05-07 DIAGNOSIS — I10 Essential (primary) hypertension: Secondary | ICD-10-CM | POA: Diagnosis not present

## 2019-05-07 DIAGNOSIS — Z20828 Contact with and (suspected) exposure to other viral communicable diseases: Secondary | ICD-10-CM | POA: Diagnosis not present

## 2019-05-07 DIAGNOSIS — M545 Low back pain: Secondary | ICD-10-CM | POA: Diagnosis not present

## 2019-05-07 DIAGNOSIS — N6011 Diffuse cystic mastopathy of right breast: Secondary | ICD-10-CM | POA: Diagnosis not present

## 2019-05-07 DIAGNOSIS — N6022 Fibroadenosis of left breast: Secondary | ICD-10-CM | POA: Diagnosis not present

## 2019-05-07 DIAGNOSIS — K219 Gastro-esophageal reflux disease without esophagitis: Secondary | ICD-10-CM | POA: Diagnosis not present

## 2019-05-07 DIAGNOSIS — Z79899 Other long term (current) drug therapy: Secondary | ICD-10-CM | POA: Diagnosis not present

## 2019-05-07 DIAGNOSIS — M542 Cervicalgia: Secondary | ICD-10-CM | POA: Diagnosis not present

## 2019-05-07 DIAGNOSIS — N62 Hypertrophy of breast: Secondary | ICD-10-CM | POA: Diagnosis not present

## 2019-05-07 DIAGNOSIS — F419 Anxiety disorder, unspecified: Secondary | ICD-10-CM | POA: Diagnosis not present

## 2019-05-07 DIAGNOSIS — Z7951 Long term (current) use of inhaled steroids: Secondary | ICD-10-CM | POA: Diagnosis not present

## 2019-05-07 DIAGNOSIS — M958 Other specified acquired deformities of musculoskeletal system: Secondary | ICD-10-CM | POA: Diagnosis not present

## 2019-05-07 DIAGNOSIS — G4733 Obstructive sleep apnea (adult) (pediatric): Secondary | ICD-10-CM | POA: Diagnosis not present

## 2019-05-07 DIAGNOSIS — D241 Benign neoplasm of right breast: Secondary | ICD-10-CM | POA: Diagnosis not present

## 2019-05-08 DIAGNOSIS — Z79899 Other long term (current) drug therapy: Secondary | ICD-10-CM | POA: Diagnosis not present

## 2019-05-08 DIAGNOSIS — Z7951 Long term (current) use of inhaled steroids: Secondary | ICD-10-CM | POA: Diagnosis not present

## 2019-05-08 DIAGNOSIS — K219 Gastro-esophageal reflux disease without esophagitis: Secondary | ICD-10-CM | POA: Diagnosis not present

## 2019-05-08 DIAGNOSIS — F419 Anxiety disorder, unspecified: Secondary | ICD-10-CM | POA: Diagnosis not present

## 2019-05-08 DIAGNOSIS — N6022 Fibroadenosis of left breast: Secondary | ICD-10-CM | POA: Diagnosis not present

## 2019-05-08 DIAGNOSIS — N6011 Diffuse cystic mastopathy of right breast: Secondary | ICD-10-CM | POA: Diagnosis not present

## 2019-05-08 DIAGNOSIS — I10 Essential (primary) hypertension: Secondary | ICD-10-CM | POA: Diagnosis not present

## 2019-05-08 DIAGNOSIS — G4733 Obstructive sleep apnea (adult) (pediatric): Secondary | ICD-10-CM | POA: Diagnosis not present

## 2019-05-08 DIAGNOSIS — Z20828 Contact with and (suspected) exposure to other viral communicable diseases: Secondary | ICD-10-CM | POA: Diagnosis not present

## 2019-05-08 DIAGNOSIS — N62 Hypertrophy of breast: Secondary | ICD-10-CM | POA: Diagnosis not present

## 2019-05-14 DIAGNOSIS — F3289 Other specified depressive episodes: Secondary | ICD-10-CM | POA: Diagnosis not present

## 2019-05-27 DIAGNOSIS — G4733 Obstructive sleep apnea (adult) (pediatric): Secondary | ICD-10-CM | POA: Diagnosis not present

## 2019-05-28 DIAGNOSIS — Z9889 Other specified postprocedural states: Secondary | ICD-10-CM | POA: Diagnosis not present

## 2019-05-28 DIAGNOSIS — T148XXA Other injury of unspecified body region, initial encounter: Secondary | ICD-10-CM | POA: Diagnosis not present

## 2019-05-28 DIAGNOSIS — Z4889 Encounter for other specified surgical aftercare: Secondary | ICD-10-CM | POA: Diagnosis not present

## 2019-05-29 DIAGNOSIS — L218 Other seborrheic dermatitis: Secondary | ICD-10-CM | POA: Diagnosis not present

## 2019-06-13 DIAGNOSIS — F3289 Other specified depressive episodes: Secondary | ICD-10-CM | POA: Diagnosis not present

## 2019-07-11 DIAGNOSIS — M25511 Pain in right shoulder: Secondary | ICD-10-CM | POA: Diagnosis not present

## 2019-07-11 DIAGNOSIS — M25512 Pain in left shoulder: Secondary | ICD-10-CM | POA: Diagnosis not present

## 2019-07-11 DIAGNOSIS — M545 Low back pain: Secondary | ICD-10-CM | POA: Diagnosis not present

## 2019-07-11 DIAGNOSIS — M542 Cervicalgia: Secondary | ICD-10-CM | POA: Diagnosis not present

## 2019-07-24 DIAGNOSIS — F3289 Other specified depressive episodes: Secondary | ICD-10-CM | POA: Diagnosis not present

## 2019-07-25 DIAGNOSIS — M545 Low back pain: Secondary | ICD-10-CM | POA: Diagnosis not present

## 2019-07-25 DIAGNOSIS — M542 Cervicalgia: Secondary | ICD-10-CM | POA: Diagnosis not present

## 2019-07-25 DIAGNOSIS — M25512 Pain in left shoulder: Secondary | ICD-10-CM | POA: Diagnosis not present

## 2019-07-25 DIAGNOSIS — M25511 Pain in right shoulder: Secondary | ICD-10-CM | POA: Diagnosis not present

## 2019-07-26 DIAGNOSIS — Z6839 Body mass index (BMI) 39.0-39.9, adult: Secondary | ICD-10-CM | POA: Diagnosis not present

## 2019-07-26 DIAGNOSIS — Z713 Dietary counseling and surveillance: Secondary | ICD-10-CM | POA: Diagnosis not present

## 2019-07-26 DIAGNOSIS — E669 Obesity, unspecified: Secondary | ICD-10-CM | POA: Diagnosis not present

## 2019-08-02 DIAGNOSIS — M545 Low back pain: Secondary | ICD-10-CM | POA: Diagnosis not present

## 2019-08-02 DIAGNOSIS — M542 Cervicalgia: Secondary | ICD-10-CM | POA: Diagnosis not present

## 2019-08-02 DIAGNOSIS — M25512 Pain in left shoulder: Secondary | ICD-10-CM | POA: Diagnosis not present

## 2019-08-02 DIAGNOSIS — M25511 Pain in right shoulder: Secondary | ICD-10-CM | POA: Diagnosis not present

## 2019-08-08 DIAGNOSIS — F3289 Other specified depressive episodes: Secondary | ICD-10-CM | POA: Diagnosis not present

## 2019-08-12 DIAGNOSIS — H1012 Acute atopic conjunctivitis, left eye: Secondary | ICD-10-CM | POA: Diagnosis not present

## 2019-10-02 DIAGNOSIS — J309 Allergic rhinitis, unspecified: Secondary | ICD-10-CM | POA: Diagnosis not present

## 2019-10-02 DIAGNOSIS — F339 Major depressive disorder, recurrent, unspecified: Secondary | ICD-10-CM | POA: Diagnosis not present

## 2019-10-02 DIAGNOSIS — F419 Anxiety disorder, unspecified: Secondary | ICD-10-CM | POA: Diagnosis not present

## 2019-10-02 DIAGNOSIS — I10 Essential (primary) hypertension: Secondary | ICD-10-CM | POA: Diagnosis not present

## 2019-10-03 DIAGNOSIS — F3289 Other specified depressive episodes: Secondary | ICD-10-CM | POA: Diagnosis not present

## 2019-10-21 DIAGNOSIS — N920 Excessive and frequent menstruation with regular cycle: Secondary | ICD-10-CM | POA: Diagnosis not present

## 2019-10-21 DIAGNOSIS — E669 Obesity, unspecified: Secondary | ICD-10-CM | POA: Diagnosis not present

## 2019-10-24 DIAGNOSIS — H5789 Other specified disorders of eye and adnexa: Secondary | ICD-10-CM | POA: Diagnosis not present

## 2019-10-24 DIAGNOSIS — R946 Abnormal results of thyroid function studies: Secondary | ICD-10-CM | POA: Diagnosis not present

## 2019-11-14 DIAGNOSIS — R0981 Nasal congestion: Secondary | ICD-10-CM | POA: Diagnosis not present

## 2019-11-14 DIAGNOSIS — Z20822 Contact with and (suspected) exposure to covid-19: Secondary | ICD-10-CM | POA: Diagnosis not present

## 2019-11-14 DIAGNOSIS — J029 Acute pharyngitis, unspecified: Secondary | ICD-10-CM | POA: Diagnosis not present

## 2019-11-18 DIAGNOSIS — F3289 Other specified depressive episodes: Secondary | ICD-10-CM | POA: Diagnosis not present

## 2019-11-21 IMAGING — CT CT MAXILLOFACIAL W/O CM
1 series · 15 of 30 positions shown, 19 images · non-contrast
Comparison: None.

CLINICAL DATA: 37 y/o  F; sinusitis and congestion for 3 months.

EXAM:
CT MAXILLOFACIAL WITHOUT CONTRAST
TECHNIQUE: Multidetector CT images of the paranasal sinuses were obtained using
the standard protocol without intravenous contrast.

[Series 6: soft tissue · axial · 0.47mm/px · z∈[-186,-23]mm · 15 of 177 slices shown, 19 images]
[im 7/177  brain]
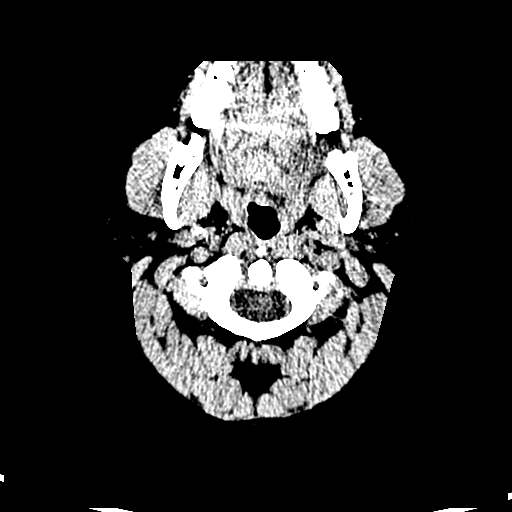
[im 7/177  bone]
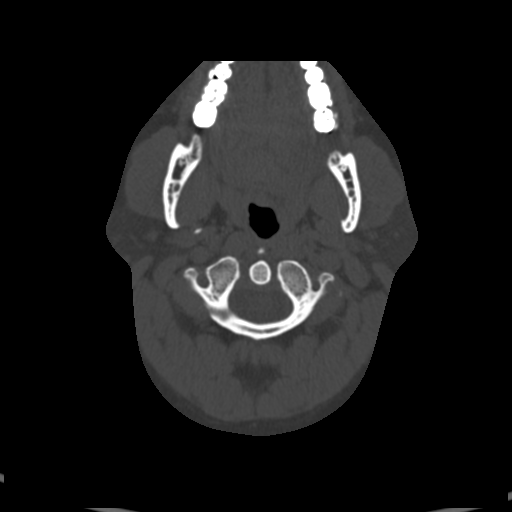
[im 19/177  bone]
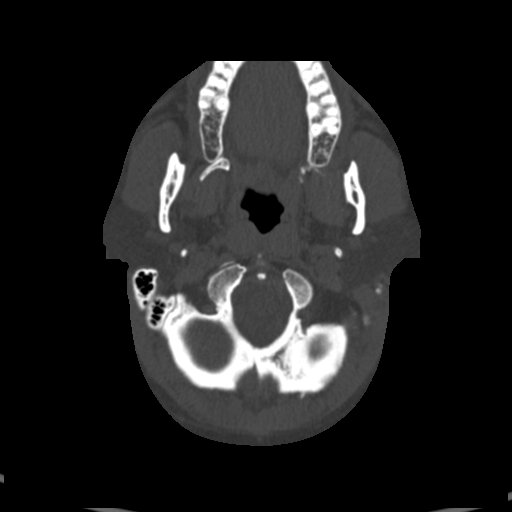
[im 31/177  bone]
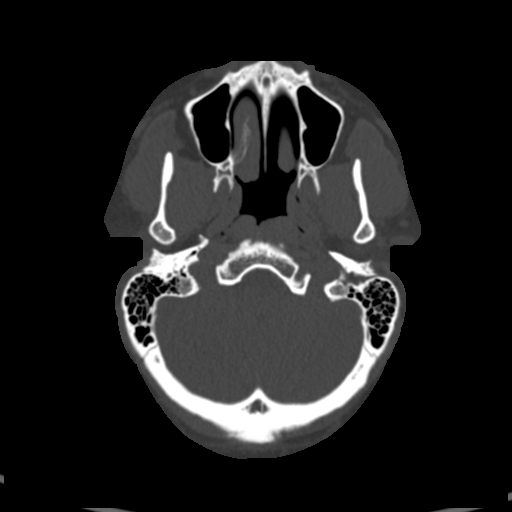
[im 43/177  bone]
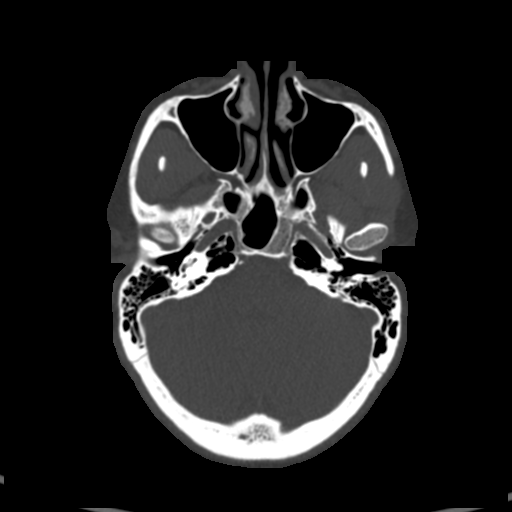
[im 55/177  brain]
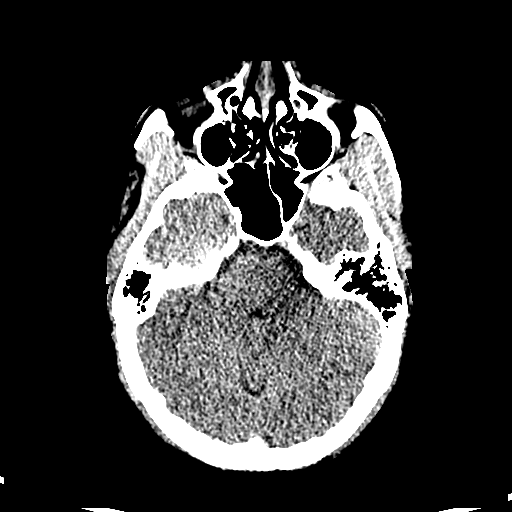
[im 55/177  bone]
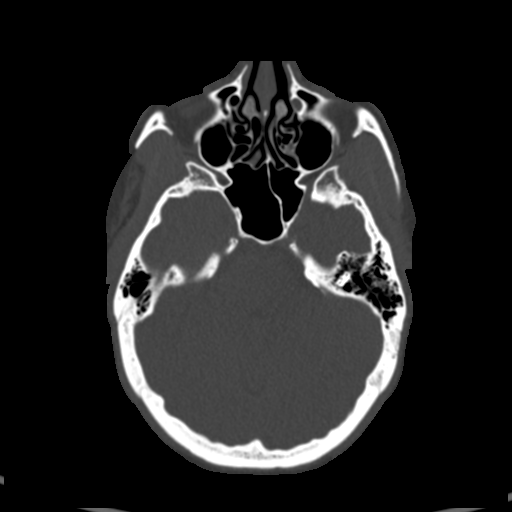
[im 67/177  bone]
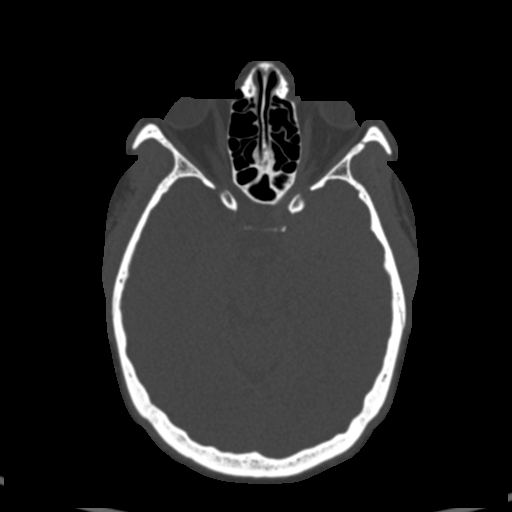
[im 79/177  bone]
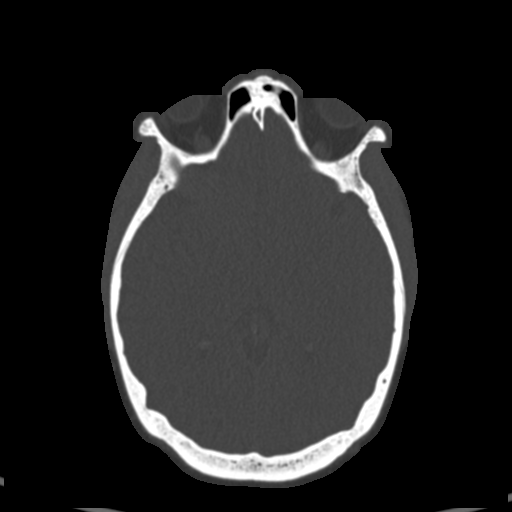
[im 92/177  bone]
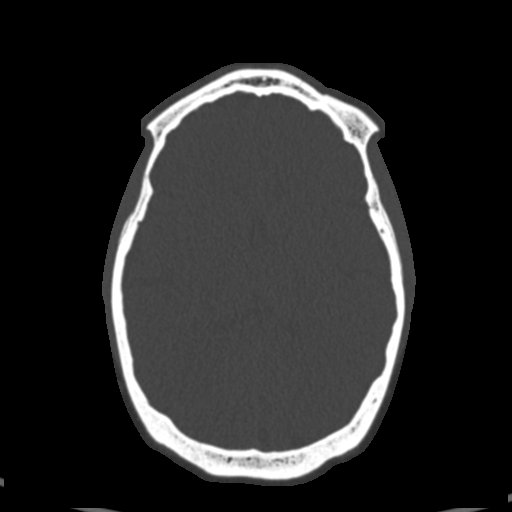
[im 98/177  brain]
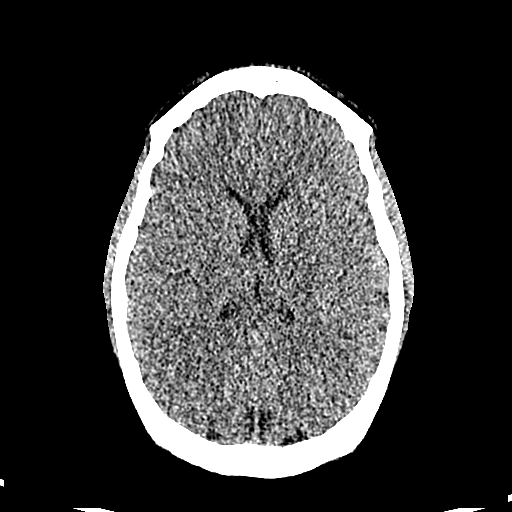
[im 98/177  bone]
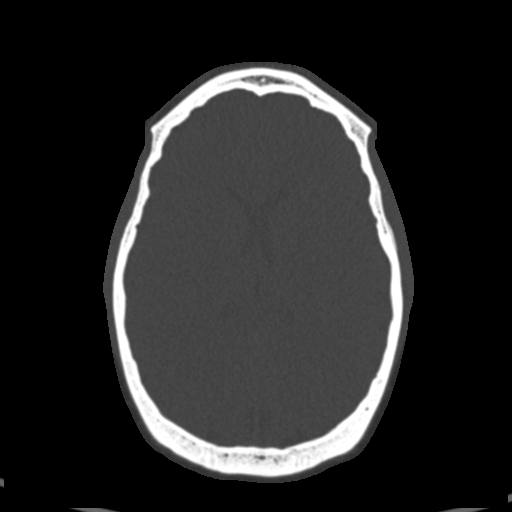
[im 110/177  bone]
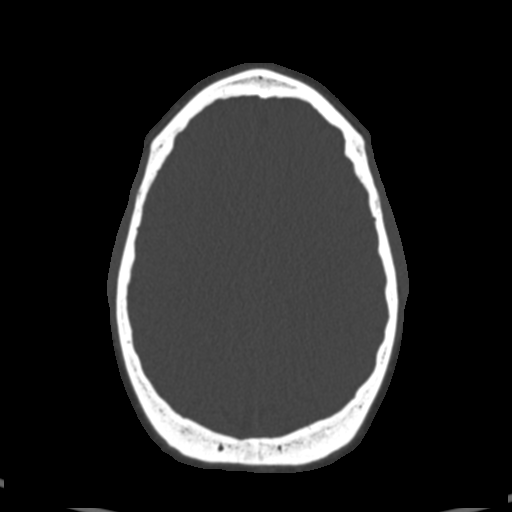
[im 122/177  bone]
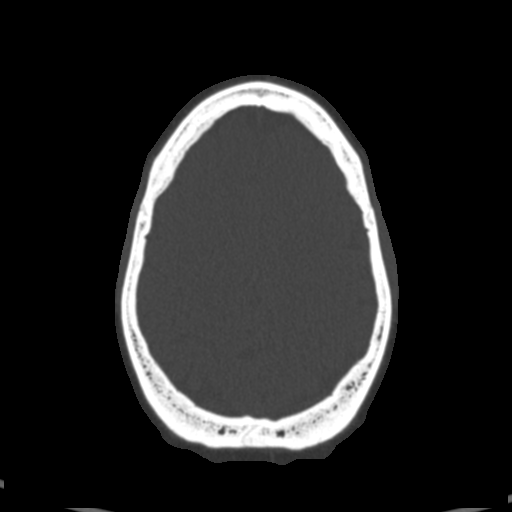
[im 134/177  bone]
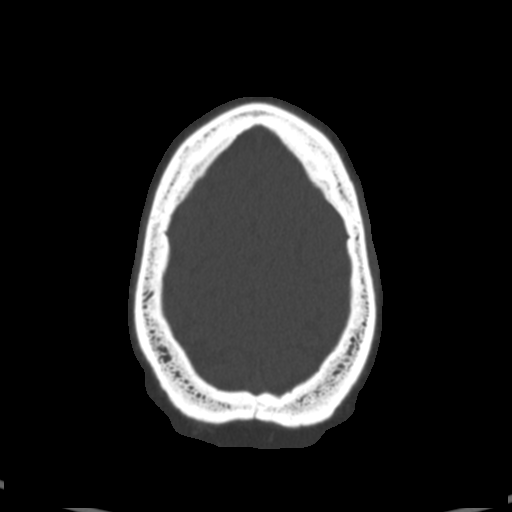
[im 146/177  brain]
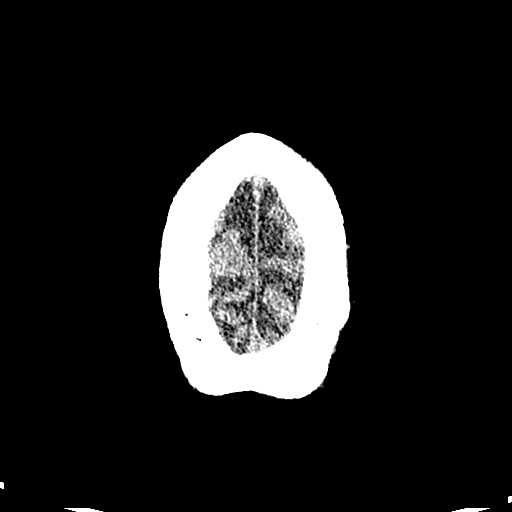
[im 146/177  bone]
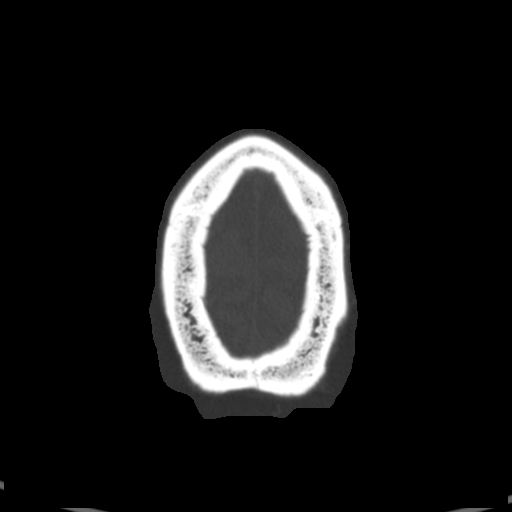
[im 158/177  bone]
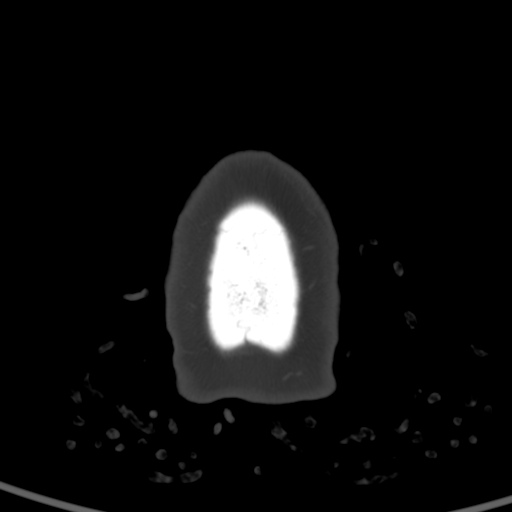
[im 170/177  bone]
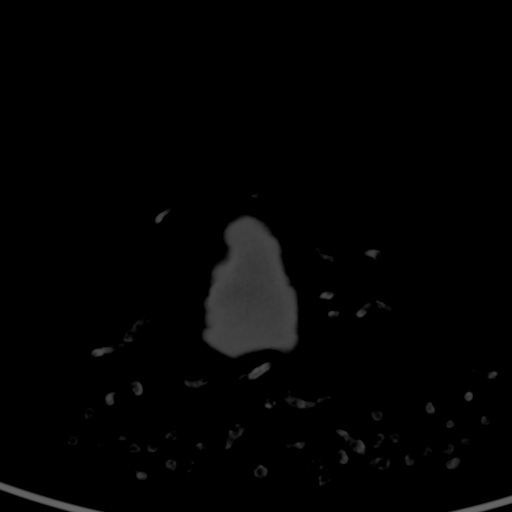

[15 of 30 positions shown; findings below may reference images not displayed]

FINDINGS: Paranasal sinuses:

Frontal: Normally aerated. Patent frontal sinus drainage pathways.

Ethmoid: Normally aerated.

Maxillary: Normally aerated.

Sphenoid: Normally aerated. Patent sphenoethmoidal recesses.

Right ostiomeatal unit: Patent.

Left ostiomeatal unit: Patent.

Nasal passages: Patent. Intact nasal septum. Minimal leftward nasal
septal deviation. Pneumatization of the vertical lamella of middle
turbinates. Bilateral prominent ethmoid bulla.

Anatomy: Pneumatization superior to the right anterior ethmoid
notch, no pneumatization superior to the left anterior ethmoid
notch. Symmetric and intact olfactory grooves, left fovea
ethmoidalis 1 mm lower than right, bilateral Keros II (4-7mm).
Sellar sphenoid pneumatization pattern. No dehiscence of carotid or
optic canals. No onodi cell.

Other: Orbits and intracranial compartment are unremarkable. Visible
mastoid air cells are normally aerated.
IMPRESSION: Normally aerated paranasal sinuses.  Patent sinus drainage pathways.

## 2019-11-29 DIAGNOSIS — M542 Cervicalgia: Secondary | ICD-10-CM | POA: Diagnosis not present

## 2019-11-29 DIAGNOSIS — M545 Low back pain: Secondary | ICD-10-CM | POA: Diagnosis not present

## 2019-11-29 DIAGNOSIS — M25511 Pain in right shoulder: Secondary | ICD-10-CM | POA: Diagnosis not present

## 2019-11-29 DIAGNOSIS — M25512 Pain in left shoulder: Secondary | ICD-10-CM | POA: Diagnosis not present

## 2019-12-09 DIAGNOSIS — M25511 Pain in right shoulder: Secondary | ICD-10-CM | POA: Diagnosis not present

## 2019-12-09 DIAGNOSIS — M545 Low back pain: Secondary | ICD-10-CM | POA: Diagnosis not present

## 2019-12-09 DIAGNOSIS — M542 Cervicalgia: Secondary | ICD-10-CM | POA: Diagnosis not present

## 2019-12-09 DIAGNOSIS — M25512 Pain in left shoulder: Secondary | ICD-10-CM | POA: Diagnosis not present

## 2019-12-20 DIAGNOSIS — M545 Low back pain: Secondary | ICD-10-CM | POA: Diagnosis not present

## 2019-12-20 DIAGNOSIS — M25512 Pain in left shoulder: Secondary | ICD-10-CM | POA: Diagnosis not present

## 2019-12-20 DIAGNOSIS — M25511 Pain in right shoulder: Secondary | ICD-10-CM | POA: Diagnosis not present

## 2019-12-20 DIAGNOSIS — M542 Cervicalgia: Secondary | ICD-10-CM | POA: Diagnosis not present

## 2019-12-24 DIAGNOSIS — R7989 Other specified abnormal findings of blood chemistry: Secondary | ICD-10-CM | POA: Diagnosis not present

## 2020-02-19 DIAGNOSIS — F3289 Other specified depressive episodes: Secondary | ICD-10-CM | POA: Diagnosis not present

## 2020-02-26 DIAGNOSIS — J3489 Other specified disorders of nose and nasal sinuses: Secondary | ICD-10-CM | POA: Diagnosis not present

## 2020-02-26 DIAGNOSIS — R05 Cough: Secondary | ICD-10-CM | POA: Diagnosis not present

## 2020-02-26 DIAGNOSIS — J3089 Other allergic rhinitis: Secondary | ICD-10-CM | POA: Diagnosis not present

## 2020-04-15 DIAGNOSIS — G4733 Obstructive sleep apnea (adult) (pediatric): Secondary | ICD-10-CM | POA: Diagnosis not present

## 2020-04-15 DIAGNOSIS — G4700 Insomnia, unspecified: Secondary | ICD-10-CM | POA: Diagnosis not present

## 2020-04-16 DIAGNOSIS — F339 Major depressive disorder, recurrent, unspecified: Secondary | ICD-10-CM | POA: Diagnosis not present

## 2020-04-16 DIAGNOSIS — I10 Essential (primary) hypertension: Secondary | ICD-10-CM | POA: Diagnosis not present

## 2020-04-16 DIAGNOSIS — Z0000 Encounter for general adult medical examination without abnormal findings: Secondary | ICD-10-CM | POA: Diagnosis not present

## 2020-04-29 DIAGNOSIS — R350 Frequency of micturition: Secondary | ICD-10-CM | POA: Diagnosis not present

## 2020-06-24 ENCOUNTER — Other Ambulatory Visit: Payer: Self-pay

## 2020-06-24 ENCOUNTER — Ambulatory Visit (INDEPENDENT_AMBULATORY_CARE_PROVIDER_SITE_OTHER): Payer: BC Managed Care – PPO | Admitting: Cardiology

## 2020-06-24 DIAGNOSIS — M25579 Pain in unspecified ankle and joints of unspecified foot: Secondary | ICD-10-CM | POA: Insufficient documentation

## 2020-06-24 DIAGNOSIS — F419 Anxiety disorder, unspecified: Secondary | ICD-10-CM | POA: Insufficient documentation

## 2020-06-24 DIAGNOSIS — R002 Palpitations: Secondary | ICD-10-CM

## 2020-06-24 DIAGNOSIS — Z889 Allergy status to unspecified drugs, medicaments and biological substances status: Secondary | ICD-10-CM | POA: Insufficient documentation

## 2020-06-24 DIAGNOSIS — I10 Essential (primary) hypertension: Secondary | ICD-10-CM

## 2020-06-24 DIAGNOSIS — L709 Acne, unspecified: Secondary | ICD-10-CM | POA: Insufficient documentation

## 2020-06-24 HISTORY — DX: Morbid (severe) obesity due to excess calories: E66.01

## 2020-06-24 HISTORY — DX: Essential (primary) hypertension: I10

## 2020-06-24 HISTORY — DX: Palpitations: R00.2

## 2020-06-24 MED ORDER — METOPROLOL SUCCINATE ER 50 MG PO TB24: 50.0000 mg | ORAL_TABLET | Freq: Every day | ORAL | 3 refills | Status: DC

## 2020-06-24 NOTE — Progress Notes (Signed)
Cardiology Office Note:    Date:  06/24/2020   ID:  Becky Tate, DOB [birth year], MRN [id number]  PCP:  Milus Height, PA  Cardiologist:  Garwin Brothers, MD   Referring MD: Milus Height, PA    ASSESSMENT:    1. Palpitations   2. Essential hypertension   3. Morbid obesity (HCC)    PLAN:    In order of problems listed above:  1. Primary prevention stressed with the patient.  Importance of compliance with diet medication stressed and she vocalized understanding. 2. Essential hypertension: Blood pressure stable and diet was emphasized.  She promises to do better with exercise.  Lifestyle modification and salt intake issues were urged. 3. Morbid obesity: Diet was emphasized.  Weight reduction was stressed.  Risks of obesity explained and she promises to do better. 4. Palpitations: Her symptoms are not very significant and I reassured her.  I will check her TSH today.  I told her lifestyle modification relaxation techniques.  In view of her blood pressure and palpitations I will initiate her on Toprol-XL 50 mg daily.  I told her to check with the pharmacist if it is okay to take with her anxiety medication and she agrees.  She will be seen in follow-up appointment in a month or earlier if she has any concerns.  She knows to go to the nearest emergency room for any concerning symptoms.   Medication Adjustments/Labs and Tests Ordered: Current medicines are reviewed at length with the patient today.  Concerns regarding medicines are outlined above.  Orders Placed This Encounter  Procedures  . TSH  . EKG 12-Lead   Meds ordered this encounter  Medications  . metoprolol succinate (TOPROL-XL) 50 MG 24 hr tablet    Sig: Take 1 tablet (50 mg total) by mouth daily. Take with or immediately following a meal.    Dispense:  30 tablet    Refill:  3     No chief complaint on file.    History of Present Illness:    Becky Tate is a 40 y.o. female.  Patient has past medical history  of palpitations obesity and essential hypertension.  She denies any problems at this time and takes care of activities of daily living.  Her only issue is that she has significant anxiety at times and this gives her palpitations.  It seems like this it certainly pressure cycle.  She is worried about having palpitations.  The last 5 minutes or so.  No orthopnea or PND.  She is leading a sedentary lifestyle and has been a little complacent with diet and exercise.  At the time of my evaluation, the patient is alert awake oriented and in no distress.  Past Medical History:  Diagnosis Date  . Acne   . Ankle pain   . Anxiety   . BMI 38.0-38.9,adult 03/16/2018  . Chest pain of uncertain etiology 10/24/2017  . H/O seasonal allergies   . Hypertriglyceridemia 03/16/2018  . Metabolic syndrome 03/16/2018  . Osteoarthritis of knee 07/25/2016  . Osteochondritis dissecans 07/25/2016    Past Surgical History:  Procedure Laterality Date  . ANKLE SURGERY      Current Medications: Current Meds  Medication Sig  . albuterol (PROVENTIL HFA;VENTOLIN HFA) 108 (90 Base) MCG/ACT inhaler Inhale 2 puffs into the lungs every 4 (four) hours as needed.  . fexofenadine (ALLEGRA) 180 MG tablet Take 180 mg by mouth daily.  . fluticasone (FLONASE) 50 MCG/ACT nasal spray Place 1 spray into the nose daily.  Marland Kitchen  LORazepam (ATIVAN) 1 MG tablet Take 1 tablet by mouth as needed for anxiety. Can also take 1/2 tablet  . losartan (COZAAR) 25 MG tablet Take 25 mg by mouth daily.  . metoprolol succinate (TOPROL-XL) 50 MG 24 hr tablet Take 1 tablet (50 mg total) by mouth daily. Take with or immediately following a meal.  . sertraline (ZOLOFT) 100 MG tablet Take 100 mg by mouth daily.     Allergies:   Oxycodone-acetaminophen, Sulfa antibiotics, and Penicillin g   Social History   Socioeconomic History  . Marital status: Single    Spouse name: Not on file  . Number of children: Not on file  . Years of education: Not on file  .  Highest education level: Not on file  Occupational History  . Not on file  Tobacco Use  . Smoking status: Never Smoker  . Smokeless tobacco: Never Used  Substance and Sexual Activity  . Alcohol use: Not on file  . Drug use: Not on file  . Sexual activity: Not on file  Other Topics Concern  . Not on file  Social History Narrative  . Not on file   Social Determinants of Health   Financial Resource Strain: Not on file  Food Insecurity: Not on file  Transportation Needs: Not on file  Physical Activity: Not on file  Stress: Not on file  Social Connections: Not on file     Family History: The patient's family history includes Hypertension in her mother.  ROS:   Please see the history of present illness.    All other systems reviewed and are negative.  EKGs/Labs/Other Studies Reviewed:    The following studies were reviewed today: I discussed my findings with the patient at length.  EKG reveals sinus rhythm and nonspecific ST-T changes.   Recent Labs: No results found for requested labs within last 8760 hours.  Recent Lipid Panel No results found for: CHOL, TRIG, HDL, CHOLHDL, VLDL, LDLCALC, LDLDIRECT  Physical Exam:    VS:  BP (!) 150/90   Pulse 94   Ht 5\' 7"  (1.702 m)   Wt 263 lb (119.3 kg)   SpO2 98%   BMI 41.19 kg/m     Wt Readings from Last 3 Encounters:  06/24/20 263 lb (119.3 kg)  10/24/17 267 lb 1.9 oz (121.2 kg)     GEN: Patient is in no acute distress HEENT: Normal NECK: No JVD; No carotid bruits LYMPHATICS: No lymphadenopathy CARDIAC: Hear sounds regular, 2/6 systolic murmur at the apex. RESPIRATORY:  Clear to auscultation without rales, wheezing or rhonchi  ABDOMEN: Soft, non-tender, non-distended MUSCULOSKELETAL:  No edema; No deformity  SKIN: Warm and dry NEUROLOGIC:  Alert and oriented x 3 PSYCHIATRIC:  Normal affect   Signed, 10/26/17, MD  06/24/2020 5:16 PM    [facility]

## 2020-06-24 NOTE — Patient Instructions (Signed)
Medication Instructions:  Your physician has recommended you make the following change in your medication:   Start Toprol XL 50 mg daily.  *If you need a refill on your cardiac medications before your next appointment, please call your pharmacy*   Lab Work: Your physician recommends that you have your TSH done today in the office.  If you have labs (blood work) drawn today and your tests are completely normal, you will receive your results only by: Marland Kitchen MyChart Message (if you have MyChart) OR . A paper copy in the mail If you have any lab test that is abnormal or we need to change your treatment, we will call you to review the results.   Testing/Procedures: None ordered   Follow-Up: At [facility], you and your health needs are our priority.  As part of our continuing mission to provide you with exceptional heart care, we have created designated Provider Care Teams.  These Care Teams include your primary Cardiologist (physician) and Advanced Practice Providers (APPs -  Physician Assistants and Nurse Practitioners) who all work together to provide you with the care you need, when you need it.  We recommend signing up for the patient portal called "MyChart".  Sign up information is provided on this After Visit Summary.  MyChart is used to connect with patients for Virtual Visits (Telemedicine).  Patients are able to view lab/test results, encounter notes, upcoming appointments, etc.  Non-urgent messages can be sent to your provider as well.   To learn more about what you can do with MyChart, go to [email or web address].    Your next appointment:   1 month(s)  The format for your next appointment:   In Person  Provider:   Belva Crome, MD   Other Instructions Metoprolol Extended-Release Tablets What is this medicine? METOPROLOL (me TOE proe lole) is a beta blocker. It decreases the amount of work your heart has to do and helps your heart beat regularly. It treats high blood  pressure and/or prevent chest pain (also called angina). It also treats heart failure. This medicine may be used for other purposes; ask your health care provider or pharmacist if you have questions. COMMON BRAND NAME(S): toprol, Toprol XL What should I tell my health care provider before I take this medicine? They need to know if you have any of these conditions:  diabetes  heart or vessel disease like slow heart rate, worsening heart failure, heart block, sick sinus syndrome or Raynaud's disease  kidney disease  liver disease  lung or breathing disease, like asthma or emphysema  pheochromocytoma  thyroid disease  an unusual or allergic reaction to metoprolol, other beta-blockers, medicines, foods, dyes, or preservatives  pregnant or trying to get pregnant  breast-feeding How should I use this medicine? Take this drug by mouth. Take it as directed on the prescription label at the same time every day. Take it with food. You may cut the tablet in half if it is scored (has a line in the middle of it). This may help you swallow the tablet if the whole tablet is too big. Be sure to take both halves. Do not take just one-half of the tablet. Keep taking it unless your health care provider tells you to stop. Talk to your health care provider about the use of this drug in children. While it may be prescribed for children as young as 6 for selected conditions, precautions do apply. Overdosage: If you think you have taken too much of this medicine contact a  poison control center or emergency room at once. NOTE: This medicine is only for you. Do not share this medicine with others. What if I miss a dose? If you miss a dose, take it as soon as you can. If it is almost time for your next dose, take only that dose. Do not take double or extra doses. What may interact with this medicine? This medicine may interact with the following medications:  certain medicines for blood pressure, heart  disease, irregular heart beat  certain medicines for depression, like monoamine oxidase (MAO) inhibitors, fluoxetine, or paroxetine  clonidine  dobutamine  epinephrine  isoproterenol  reserpine This list may not describe all possible interactions. Give your health care provider a list of all the medicines, herbs, non-prescription drugs, or dietary supplements you use. Also tell them if you smoke, drink alcohol, or use illegal drugs. Some items may interact with your medicine. What should I watch for while using this medicine? Visit your doctor or health care professional for regular check ups. Contact your doctor right away if your symptoms worsen. Check your blood pressure and pulse rate regularly. Ask your health care professional what your blood pressure and pulse rate should be, and when you should contact them. You may get drowsy or dizzy. Do not drive, use machinery, or do anything that needs mental alertness until you know how this medicine affects you. Do not sit or stand up quickly, especially if you are an older patient. This reduces the risk of dizzy or fainting spells. Contact your doctor if these symptoms continue. Alcohol may interfere with the effect of this medicine. Avoid alcoholic drinks. This medicine may increase blood sugar. Ask your healthcare provider if changes in diet or medicines are needed if you have diabetes. What side effects may I notice from receiving this medicine? Side effects that you should report to your doctor or health care professional as soon as possible:  allergic reactions like skin rash, itching or hives  cold or numb hands or feet  depression  difficulty breathing  faint  fever with sore throat  irregular heartbeat, chest pain  rapid weight gain  signs and symptoms of high blood sugar such as being more thirsty or hungry or having to urinate more than normal. You may also feel very tired or have blurry vision.  swollen legs or  ankles Side effects that usually do not require medical attention (report to your doctor or health care professional if they continue or are bothersome):  anxiety or nervousness  change in sex drive or performance  dry skin  headache  nightmares or trouble sleeping  short term memory loss  stomach upset or diarrhea This list may not describe all possible side effects. Call your doctor for medical advice about side effects. You may report side effects to FDA at 1-800-FDA-1088. Where should I keep my medicine? Keep out of the reach of children and pets. Store at room temperature between 20 and 25 degrees C (68 and 77 degrees F). Throw away any unused drug after the expiration date. NOTE: This sheet is a summary. It may not cover all possible information. If you have questions about this medicine, talk to your doctor, pharmacist, or health care provider.  2021 Elsevier/Gold Standard (2018-12-27 18:23:00)

## 2020-06-26 DIAGNOSIS — G4733 Obstructive sleep apnea (adult) (pediatric): Secondary | ICD-10-CM | POA: Diagnosis not present

## 2020-07-02 DIAGNOSIS — G4733 Obstructive sleep apnea (adult) (pediatric): Secondary | ICD-10-CM | POA: Diagnosis not present

## 2020-07-20 DIAGNOSIS — F3289 Other specified depressive episodes: Secondary | ICD-10-CM | POA: Diagnosis not present

## 2020-07-24 ENCOUNTER — Ambulatory Visit: Payer: BC Managed Care – PPO | Admitting: Cardiology

## 2020-07-29 DIAGNOSIS — J358 Other chronic diseases of tonsils and adenoids: Secondary | ICD-10-CM | POA: Diagnosis not present

## 2020-08-12 ENCOUNTER — Telehealth: Payer: Self-pay | Admitting: Cardiology

## 2020-08-12 NOTE — Telephone Encounter (Signed)
Pt c/o medication issue:  1. Name of Medication: metoprolol succinate (TOPROL-XL) 50 MG 24 hr tablet  2. How are you currently taking this medication (dosage and times per day)? 1 tablet daily  3. Are you having a reaction (difficulty breathing--STAT)? no  4. What is your medication issue? Patient states she thinks she is having side effects from the medication. She states her bones hurt in places she injured them when she was younger. She states she noticed this toward the end of February. She states she also hurts in her lower legs when she lays down or sits. She states this started Thursday or Friday. She states she also has had headaches, but she is not sure if that is her sinus'. She states she also had swelling in her ankles yesterday and it is still slightly swollen. She states She is not sure if she has gained any weight. She states since last Thursday or Friday she has also been irritable and angry. She states she does not believe it is related to her menstrual cycle.

## 2020-08-13 MED ORDER — DILTIAZEM HCL ER COATED BEADS 120 MG PO CP24: 120.0000 mg | ORAL_CAPSULE | Freq: Every day | ORAL | 1 refills | Status: DC

## 2020-08-13 NOTE — Telephone Encounter (Signed)
She can switch this medicine and stop it.  She can go to Cardizem CD 120 mg daily and tell us how she feels.

## 2020-08-13 NOTE — Telephone Encounter (Signed)
Called patient informed her of Dr. Kem Parkinson note. She has decided to try to cardizem. She will stop metoprolol and start cardizem 120 mg daily. If this doesn't help she will let us know.

## 2020-08-13 NOTE — Telephone Encounter (Signed)
That is entirely up to her.  She can always keep the prescription with her and use it as needed.  That would be her decision to do her research and see if there are any other alternative therapies.  I would respect it.

## 2020-08-13 NOTE — Telephone Encounter (Signed)
Called patient. Gave her Dr. Kem Parkinson recommendation. She does not want to start cardizem right now she wants to do more research before starting. She would really like to know if there is something she can do that does not involve medication. Also she wants to know if she can stop metoprolol without starting anything new. Will check with Dr. Tomie China.

## 2020-08-18 ENCOUNTER — Ambulatory Visit (INDEPENDENT_AMBULATORY_CARE_PROVIDER_SITE_OTHER): Payer: BC Managed Care – PPO | Admitting: Otolaryngology

## 2020-08-18 ENCOUNTER — Other Ambulatory Visit: Payer: Self-pay

## 2020-08-18 ENCOUNTER — Encounter (INDEPENDENT_AMBULATORY_CARE_PROVIDER_SITE_OTHER): Payer: Self-pay | Admitting: Otolaryngology

## 2020-08-18 VITALS — Temp 97.3°F

## 2020-08-18 DIAGNOSIS — J358 Other chronic diseases of tonsils and adenoids: Secondary | ICD-10-CM

## 2020-08-18 DIAGNOSIS — H6123 Impacted cerumen, bilateral: Secondary | ICD-10-CM | POA: Diagnosis not present

## 2020-08-18 NOTE — Progress Notes (Signed)
HPI: Becky Tate is a 40 y.o. female who returns today for evaluation of recent bout of frequent tonsil stones that were hard to get rid of.  Apparently she finally got rid of the tonsil stones and is doing better presently.  She also has wax buildup in her ears and needed this cleaned. She has history of obstructive sleep apnea and is on CPAP machine..  Past Medical History:  Diagnosis Date  . Acne   . Ankle pain   . Anxiety   . BMI 38.0-38.9,adult 03/16/2018  . Chest pain of uncertain etiology 10/24/2017  . H/O seasonal allergies   . Hypertriglyceridemia 03/16/2018  . Metabolic syndrome 03/16/2018  . Osteoarthritis of knee 07/25/2016  . Osteochondritis dissecans 07/25/2016   Past Surgical History:  Procedure Laterality Date  . ANKLE SURGERY     Social History   Socioeconomic History  . Marital status: Single    Spouse name: Not on file  . Number of children: Not on file  . Years of education: Not on file  . Highest education level: Not on file  Occupational History  . Not on file  Tobacco Use  . Smoking status: Never Smoker  . Smokeless tobacco: Never Used  Substance and Sexual Activity  . Alcohol use: Not on file  . Drug use: Not on file  . Sexual activity: Not on file  Other Topics Concern  . Not on file  Social History Narrative  . Not on file   Social Determinants of Health   Financial Resource Strain: Not on file  Food Insecurity: Not on file  Transportation Needs: Not on file  Physical Activity: Not on file  Stress: Not on file  Social Connections: Not on file   Family History  Problem Relation Age of Onset  . Hypertension Mother    Allergies  Allergen Reactions  . Oxycodone-Acetaminophen Anaphylaxis  . Sulfa Antibiotics Anaphylaxis, Hives and Rash  . Penicillin G Rash   Prior to Admission medications   Medication Sig Start Date End Date Taking? Authorizing Provider  albuterol (PROVENTIL HFA;VENTOLIN HFA) 108 (90 Base) MCG/ACT inhaler Inhale 2  puffs into the lungs every 4 (four) hours as needed. 09/11/17   [provider]  diltiazem (CARDIZEM CD) 120 MG 24 hr capsule Take 1 capsule (120 mg total) by mouth daily. 08/13/20   Revankar, Aundra Dubin, MD  fexofenadine (ALLEGRA) 180 MG tablet Take 180 mg by mouth daily.    [provider]  fluticasone (FLONASE) 50 MCG/ACT nasal spray Place 1 spray into the nose daily.    [provider]  LORazepam (ATIVAN) 1 MG tablet Take 1 tablet by mouth as needed for anxiety. Can also take 1/2 tablet 10/11/17   [provider]  losartan (COZAAR) 25 MG tablet Take 25 mg by mouth daily. 04/11/20   [provider]  sertraline (ZOLOFT) 100 MG tablet Take 100 mg by mouth daily.    [provider]     Positive ROS: Otherwise negative  All other systems have been reviewed and were otherwise negative with the exception of those mentioned in the HPI and as above.  Physical Exam: Constitutional: Alert, well-appearing, no acute distress Ears: External ears without lesions or tenderness.  She has a moderate amount of wax in both ear canals that was cleaned with suction.  Ear canals and TMs are otherwise clear. Nasal: External nose without lesions. Septum relatively midline but with chronic rhinitis and large turbinates bilaterally..  Both middle meatus regions are clear  no polyps noted. Oral: Lips and gums without lesions. Tongue and palate mucosa without lesions. Posterior oropharynx clear.  Tonsils are small to average size 1-2+.  No significant tonsil stones or signs of infection noted on clinical exam today. Neck: No palpable adenopathy or masses Respiratory: Breathing comfortably  Skin: No facial/neck lesions or rash noted.  Cerumen impaction removal  Date/Time: 08/18/2020 5:40 PM Performed by: Drema Halon, MD Authorized by: Drema Halon, MD   Consent:    Consent obtained:  Verbal   Consent given by:  Patient   Risks discussed:  Pain  and bleeding Procedure details:    Location:  L ear and R ear   Procedure type: curette and suction   Post-procedure details:    Inspection:  TM intact and canal normal   Hearing quality:  Improved   Patient tolerance of procedure:  Tolerated well, no immediate complications Comments:     TMs are clear bilaterally.    Assessment: History of tonsil stones. Wax buildup in both ears.  Plan: Ear canals were cleaned in the office today.  She will follow-up as needed. Briefly discussed oral hygiene with her concerning tonsil stones as well as gargling and using Q-tips or toothbrush to extrude the stones.  If they continue to give her trouble briefly discussed the option of tonsillectomy but would not recommend this presently.  I reviewed the morbidity associated with tonsillectomy.   Narda Bonds, MD

## 2020-09-25 ENCOUNTER — Emergency Department (HOSPITAL_COMMUNITY)
Admission: EM | Admit: 2020-09-25 | Discharge: 2020-09-26 | Disposition: A | Discharge status: Home / Self Care | Payer: BC Managed Care – PPO | Attending: Emergency Medicine | Admitting: Emergency Medicine

## 2020-09-25 ENCOUNTER — Encounter (HOSPITAL_COMMUNITY): Payer: Self-pay

## 2020-09-25 ENCOUNTER — Other Ambulatory Visit: Payer: Self-pay

## 2020-09-25 DIAGNOSIS — R599 Enlarged lymph nodes, unspecified: Secondary | ICD-10-CM | POA: Diagnosis not present

## 2020-09-25 DIAGNOSIS — R590 Localized enlarged lymph nodes: Secondary | ICD-10-CM | POA: Diagnosis not present

## 2020-09-25 DIAGNOSIS — M79622 Pain in left upper arm: Secondary | ICD-10-CM | POA: Diagnosis not present

## 2020-09-25 DIAGNOSIS — I10 Essential (primary) hypertension: Secondary | ICD-10-CM | POA: Insufficient documentation

## 2020-09-25 HISTORY — DX: Essential (primary) hypertension: I10

## 2020-09-25 NOTE — ED Triage Notes (Signed)
Pt had covid booster 3 days ago. States that since then she has had aching, malaise, fevers and pain in left arm. Pt received vaccine in left arm. Pt states she had similar reactions after previous vaccines but pain is worse in arm this time.

## 2020-09-25 NOTE — ED Notes (Signed)
Pt states she has not taken her blood pressure medication tonight.

## 2020-09-26 DIAGNOSIS — R590 Localized enlarged lymph nodes: Secondary | ICD-10-CM | POA: Diagnosis not present

## 2020-09-26 NOTE — Discharge Instructions (Addendum)
You were evaluated in the Emergency Department and after careful evaluation, we did not find any emergent condition requiring admission or further testing in the hospital.  Your exam/testing today was overall reassuring.  Your discomfort seems to be due to some swelling of the lymph nodes and lymph system related to the recent COVID vaccine.  Recommend Tylenol or Motrin for discomfort, can use hot or cold packs on the area as well.  Please return to the Emergency Department if you experience any worsening of your condition.  Thank you for allowing [place] to be a part of your care.

## 2020-09-26 NOTE — ED Notes (Signed)
Patient verbalizes understanding of discharge instructions. Opportunity for questioning and answers were provided. Armband removed by staff, pt discharged from ED ambulatory.   

## 2020-09-26 NOTE — ED Notes (Signed)
Dr. Pilar Plate notified of pt BP - pt cleared for DC

## 2020-09-26 NOTE — ED Provider Notes (Signed)
[facility]EMER[facility]ENC[facility]PT [facility] Emergency Department Provider Note MRN:  [id number]  Arrival date & time: 09/26/20     Chief Complaint   myalgia   History of Present Illness   Becky Tate is a 40 y.o. year-old female with no pertinent past medical presenting to the ED with chief complaint of myalgia.  Patient with discomfort to the inner upper left arm.  Progressive mild pain since getting the COVID booster in the same arm 4 days ago.  Experienced some fever and malaise the day after the COVID booster but feeling better now.  Denies any noticeable swelling or redness to the arm, just soreness.  Otherwise no complaints, no chest pain or shortness of breath.  Review of Systems  A complete 10 system review of systems was obtained and all systems are negative except as noted in the HPI and PMH.   Patient's Health History    Past Medical History:  Diagnosis Date  . Acne   . Ankle pain   . Anxiety   . BMI 38.0-38.9,adult 03/16/2018  . Chest pain of uncertain etiology 10/24/2017  . H/O seasonal allergies   . Hypertension   . Hypertriglyceridemia 03/16/2018  . Metabolic syndrome 03/16/2018  . Osteoarthritis of knee 07/25/2016  . Osteochondritis dissecans 07/25/2016    Past Surgical History:  Procedure Laterality Date  . ANKLE SURGERY      Family History  Problem Relation Age of Onset  . Hypertension Mother     Social History   Socioeconomic History  . Marital status: Single    Spouse name: Not on file  . Number of children: Not on file  . Years of education: Not on file  . Highest education level: Not on file  Occupational History  . Not on file  Tobacco Use  . Smoking status: Never Smoker  . Smokeless tobacco: Never Used  Substance and Sexual Activity  . Alcohol use: Yes    Comment: ocasionally  . Drug use: Never  . Sexual activity: Not on file  Other Topics Concern  . Not on file  Social History Narrative  . Not on file   Social Determinants of Health    Financial Resource Strain: Not on file  Food Insecurity: Not on file  Transportation Needs: Not on file  Physical Activity: Not on file  Stress: Not on file  Social Connections: Not on file  Intimate Partner Violence: Not on file     Physical Exam   Vitals:   09/25/20 2239  BP: (!) 174/110  Pulse: (!) 101  Resp: 18  Temp: 98.7 F (37.1 C)  SpO2: 96%    CONSTITUTIONAL: Well-appearing, NAD NEURO:  Alert and oriented x 3, no focal deficits EYES:  eyes equal and reactive ENT/NECK:  no LAD, no JVD CARDIO: Regular rate, well-perfused, normal S1 and S2 PULM:  CTAB no wheezing or rhonchi GI/GU:  normal bowel sounds, non-distended, non-tender MSK/SPINE:  No gross deformities, no edema, mild tenderness to the inner left upper arm, mild adenopathy to the left axilla with tenderness SKIN:  no rash, atraumatic PSYCH:  Appropriate speech and behavior  *Additional and/or pertinent findings included in MDM below  Diagnostic and Interventional Summary    EKG Interpretation  Date/Time:    Ventricular Rate:    PR Interval:    QRS Duration:   QT Interval:    QTC Calculation:   R Axis:     Text Interpretation:        Labs Reviewed - No data to  display  No orders to display    Medications - No data to display   Procedures  /  Critical Care Procedures  ED Course and Medical Decision Making  I have reviewed the triage vital signs, the nursing notes, and pertinent available records from the EMR.  Listed above are laboratory and imaging tests that I personally ordered, reviewed, and interpreted and then considered in my medical decision making (see below for details).  Arm is neurovascularly intact, there is no evident swelling or increased warmth or erythema, highly doubt DVT.  No evidence of infection.  There is reactive lymphadenopathy to the left axilla, somewhat expected given the recent COVID booster.  Provided reassurance, no emergent process, appropriate for  discharge.       Elmer Sow. Pilar Plate, MD [facility] Emergency Medicine [facility] [email or web address]  Final Clinical Impressions(s) / ED Diagnoses     ICD-10-CM   1. Reactive lymphadenopathy  R59.9     ED Discharge Orders    None       Discharge Instructions Discussed with and Provided to Patient:     Discharge Instructions     You were evaluated in the Emergency Department and after careful evaluation, we did not find any emergent condition requiring admission or further testing in the hospital.  Your exam/testing today was overall reassuring.  Your discomfort seems to be due to some swelling of the lymph nodes and lymph system related to the recent COVID vaccine.  Recommend Tylenol or Motrin for discomfort, can use hot or cold packs on the area as well.  Please return to the Emergency Department if you experience any worsening of your condition.  Thank you for allowing [place] to be a part of your care.       Sabas Sous, MD 09/26/20 [phone]

## 2020-10-07 DIAGNOSIS — F3289 Other specified depressive episodes: Secondary | ICD-10-CM | POA: Diagnosis not present

## 2020-10-14 DIAGNOSIS — F3289 Other specified depressive episodes: Secondary | ICD-10-CM | POA: Diagnosis not present

## 2020-10-28 DIAGNOSIS — F3289 Other specified depressive episodes: Secondary | ICD-10-CM | POA: Diagnosis not present

## 2020-11-23 DIAGNOSIS — Z20822 Contact with and (suspected) exposure to covid-19: Secondary | ICD-10-CM | POA: Diagnosis not present

## 2020-11-23 DIAGNOSIS — H6123 Impacted cerumen, bilateral: Secondary | ICD-10-CM | POA: Diagnosis not present

## 2020-11-23 DIAGNOSIS — H6691 Otitis media, unspecified, right ear: Secondary | ICD-10-CM | POA: Diagnosis not present

## 2020-11-25 DIAGNOSIS — F3289 Other specified depressive episodes: Secondary | ICD-10-CM | POA: Diagnosis not present

## 2020-12-08 DIAGNOSIS — F4311 Post-traumatic stress disorder, acute: Secondary | ICD-10-CM | POA: Diagnosis not present

## 2020-12-15 DIAGNOSIS — F4311 Post-traumatic stress disorder, acute: Secondary | ICD-10-CM | POA: Diagnosis not present

## 2020-12-29 DIAGNOSIS — K130 Diseases of lips: Secondary | ICD-10-CM | POA: Diagnosis not present

## 2020-12-29 DIAGNOSIS — L218 Other seborrheic dermatitis: Secondary | ICD-10-CM | POA: Diagnosis not present

## 2020-12-30 DIAGNOSIS — F4311 Post-traumatic stress disorder, acute: Secondary | ICD-10-CM | POA: Diagnosis not present

## 2021-01-13 DIAGNOSIS — F4311 Post-traumatic stress disorder, acute: Secondary | ICD-10-CM | POA: Diagnosis not present

## 2021-01-18 ENCOUNTER — Other Ambulatory Visit: Payer: Self-pay | Admitting: *Deleted

## 2021-01-18 DIAGNOSIS — G4733 Obstructive sleep apnea (adult) (pediatric): Secondary | ICD-10-CM | POA: Diagnosis not present

## 2021-01-18 MED ORDER — DILTIAZEM HCL ER COATED BEADS 120 MG PO CP24: 120.0000 mg | ORAL_CAPSULE | Freq: Every day | ORAL | 1 refills | Status: DC

## 2021-01-19 DIAGNOSIS — F419 Anxiety disorder, unspecified: Secondary | ICD-10-CM | POA: Diagnosis not present

## 2021-01-19 DIAGNOSIS — I10 Essential (primary) hypertension: Secondary | ICD-10-CM | POA: Diagnosis not present

## 2021-01-19 DIAGNOSIS — Z6841 Body Mass Index (BMI) 40.0 and over, adult: Secondary | ICD-10-CM | POA: Diagnosis not present

## 2021-01-22 DIAGNOSIS — G4709 Other insomnia: Secondary | ICD-10-CM | POA: Diagnosis not present

## 2021-01-22 DIAGNOSIS — F419 Anxiety disorder, unspecified: Secondary | ICD-10-CM | POA: Diagnosis not present

## 2021-01-26 DIAGNOSIS — F4311 Post-traumatic stress disorder, acute: Secondary | ICD-10-CM | POA: Diagnosis not present

## 2021-02-09 DIAGNOSIS — F4311 Post-traumatic stress disorder, acute: Secondary | ICD-10-CM | POA: Diagnosis not present

## 2021-02-23 DIAGNOSIS — F4311 Post-traumatic stress disorder, acute: Secondary | ICD-10-CM | POA: Diagnosis not present

## 2021-03-09 DIAGNOSIS — F4311 Post-traumatic stress disorder, acute: Secondary | ICD-10-CM | POA: Diagnosis not present

## 2021-04-01 DIAGNOSIS — G4733 Obstructive sleep apnea (adult) (pediatric): Secondary | ICD-10-CM | POA: Diagnosis not present

## 2021-04-08 DIAGNOSIS — F4311 Post-traumatic stress disorder, acute: Secondary | ICD-10-CM | POA: Diagnosis not present

## 2021-04-19 DIAGNOSIS — G4733 Obstructive sleep apnea (adult) (pediatric): Secondary | ICD-10-CM | POA: Diagnosis not present

## 2021-04-20 DIAGNOSIS — G4733 Obstructive sleep apnea (adult) (pediatric): Secondary | ICD-10-CM | POA: Diagnosis not present

## 2021-04-29 DIAGNOSIS — F4311 Post-traumatic stress disorder, acute: Secondary | ICD-10-CM | POA: Diagnosis not present

## 2021-05-11 DIAGNOSIS — F4311 Post-traumatic stress disorder, acute: Secondary | ICD-10-CM | POA: Diagnosis not present

## 2021-07-14 ENCOUNTER — Other Ambulatory Visit: Payer: Self-pay | Admitting: Cardiology

## 2021-07-14 NOTE — Telephone Encounter (Signed)
Diltiazem 120 mg # 30 x 1 refills with message for patient and pharmacy needs appointment for future refills / attempt #1  Sent to  [id number] [place], [place] - [address] AT [facility]

## 2021-10-12 ENCOUNTER — Other Ambulatory Visit: Payer: Self-pay

## 2021-10-12 DIAGNOSIS — Z13228 Encounter for screening for other metabolic disorders: Secondary | ICD-10-CM

## 2021-10-12 HISTORY — DX: Encounter for screening for other metabolic disorders: Z13.228

## 2021-10-14 ENCOUNTER — Ambulatory Visit: Payer: BC Managed Care – PPO | Admitting: Cardiology

## 2021-10-14 ENCOUNTER — Encounter: Payer: Self-pay | Admitting: Cardiology

## 2021-10-14 VITALS — BP 126/80 | HR 90 | Ht 67.6 in | Wt 286.0 lb

## 2021-10-14 DIAGNOSIS — I10 Essential (primary) hypertension: Secondary | ICD-10-CM | POA: Diagnosis not present

## 2021-10-14 DIAGNOSIS — E781 Pure hyperglyceridemia: Secondary | ICD-10-CM | POA: Diagnosis not present

## 2021-10-14 MED ORDER — DILTIAZEM HCL ER BEADS 120 MG PO CP24: 120.0000 mg | ORAL_CAPSULE | Freq: Every day | ORAL | 0 refills | Status: DC

## 2021-10-14 MED ORDER — LOSARTAN POTASSIUM 25 MG PO TABS: 25.0000 mg | ORAL_TABLET | Freq: Every day | ORAL | 0 refills | Status: DC

## 2021-10-14 NOTE — Patient Instructions (Signed)
Medication Instructions:  Your physician recommends that you continue on your current medications as directed. Please refer to the Current Medication list given to you today.  *If you need a refill on your cardiac medications before your next appointment, please call your pharmacy*   Lab Work: Your physician recommends that you return for lab work in: the next few days. You need to have labs done when you are fasting. You do not need to make an appointment as the order has already been placed. The labs you are going to have done are BMET, CBC, TSH, LFT and Lipids.  [facility] [address] in [place]. They also close daily for lunch for 12-1.   [facility] [address] 2nd floor M-W 8-11:30 and 1-4:30 and Thursday and Friday 8-11:30.   If you have labs (blood work) drawn today and your tests are completely normal, you will receive your results only by: MyChart Message (if you have MyChart) OR A paper copy in the mail If you have any lab test that is abnormal or we need to change your treatment, we will call you to review the results.   Testing/Procedures: None ordered   Follow-Up: At [facility], you and your health needs are our priority.  As part of our continuing mission to provide you with exceptional heart care, we have created designated Provider Care Teams.  These Care Teams include your primary Cardiologist (physician) and Advanced Practice Providers (APPs -  Physician Assistants and Nurse Practitioners) who all work together to provide you with the care you need, when you need it.  We recommend signing up for the patient portal called "MyChart".  Sign up information is provided on this After Visit Summary.  MyChart is used to connect with patients for Virtual Visits (Telemedicine).  Patients are able to view lab/test results, encounter notes, upcoming appointments, etc.  Non-urgent messages can be sent to your provider as well.   To learn more about what you  can do with MyChart, go to [email or web address].    Your next appointment:   12 month(s)  The format for your next appointment:   In Person  Provider:   Rajan Revankar, MD   Other Instructions NA  

## 2021-10-14 NOTE — Progress Notes (Signed)
Cardiology Office Note:    Date:  10/14/2021   ID:  Becky Tate, DOB Jun 26, 1980, MRN [id number]  PCP:  Pcp, No  Cardiologist:  Garwin Brothers, MD   Referring MD: No ref. provider found    ASSESSMENT:    1. Essential hypertension   2. Hypertriglyceridemia   3. Morbid obesity (HCC)    PLAN:    In order of problems listed above:  Primary prevention stressed with the patient.  Importance of compliance with diet medication stressed and she vocalized understanding she was advised to walk at least half an hour a day 5 days a week and she promises to do so. Essential hypertension: Blood pressure stable.  Diet was emphasized.  Lifestyle modification urged. Mixed dyslipidemia: She has not had blood work for quite some time she will come back in the next few days for complete blood work including lipids. Morbid obesity: Weight reduction stressed and diet was emphasized.  Lifestyle modification urged.  Obesity explained and she promises to do better. Patient will be seen in follow-up appointment in 12 months or earlier if the patient has any concerns    Medication Adjustments/Labs and Tests Ordered: Current medicines are reviewed at length with the patient today.  Concerns regarding medicines are outlined above.  No orders of the defined types were placed in this encounter.  No orders of the defined types were placed in this encounter.    No chief complaint on file.    History of Present Illness:    Becky Tate is a 41 y.o. female.  Patient has past medical history of essential hypertension, hypertriglyceridemia and morbid obesity.  She denies any problems at this time and takes care of activities of daily living.  No chest pain orthopnea or PND.  She exercises on a regular basis without any symptoms.  At the time of my evaluation, the patient is alert awake oriented and in no distress.  Past Medical History:  Diagnosis Date   Acne    Ankle pain    Anxiety    BMI  38.0-38.9,adult 03/16/2018   Chest pain of uncertain etiology 10/24/2017   Encounter for screening for other metabolic disorders 10/12/2021   Essential hypertension 06/24/2020   H/O seasonal allergies    Hypertriglyceridemia 03/16/2018   Metabolic syndrome 03/16/2018   Morbid obesity (HCC) 06/24/2020   Osteoarthritis of knee 07/25/2016   Osteochondritis dissecans 07/25/2016   Palpitations 06/24/2020    Past Surgical History:  Procedure Laterality Date   ANKLE SURGERY      Current Medications: Current Meds  Medication Sig   albuterol (PROVENTIL HFA;VENTOLIN HFA) 108 (90 Base) MCG/ACT inhaler Inhale 2 puffs into the lungs every 4 (four) hours as needed for wheezing or shortness of breath.   diltiazem (TIADYLT ER) 120 MG 24 hr capsule Take 1 capsule (120 mg total) by mouth daily. NEED APPOINTMENT FOR FUTURE REFILLS   fexofenadine (ALLEGRA) 180 MG tablet Take 180 mg by mouth daily.   losartan (COZAAR) 25 MG tablet Take 25 mg by mouth daily.     Allergies:   Oxycodone-acetaminophen, Sulfa antibiotics, and Penicillin g   Social History   Socioeconomic History   Marital status: Single    Spouse name: Not on file   Number of children: Not on file   Years of education: Not on file   Highest education level: Not on file  Occupational History   Not on file  Tobacco Use   Smoking status: Never   Smokeless tobacco: Never  Substance and Sexual Activity   Alcohol use: Yes    Comment: ocasionally   Drug use: Never   Sexual activity: Not on file  Other Topics Concern   Not on file  Social History Narrative   Not on file   Social Determinants of Health   Financial Resource Strain: Not on file  Food Insecurity: Not on file  Transportation Needs: Not on file  Physical Activity: Not on file  Stress: Not on file  Social Connections: Not on file     Family History: The patient's family history includes Hypertension in her mother.  ROS:   Please see the history of present  illness.    All other systems reviewed and are negative.  EKGs/Labs/Other Studies Reviewed:    The following studies were reviewed today: I discussed my findings with the patient at length.   Recent Labs: No results found for requested labs within last 8760 hours.  Recent Lipid Panel No results found for: CHOL, TRIG, HDL, CHOLHDL, VLDL, LDLCALC, LDLDIRECT  Physical Exam:    VS:  BP 126/80   Pulse 90   Ht 5' 7.6" (1.717 m)   Wt 286 lb 0.6 oz (129.7 kg)   SpO2 98%   BMI 44.01 kg/m     Wt Readings from Last 3 Encounters:  10/14/21 286 lb 0.6 oz (129.7 kg)  06/24/20 263 lb (119.3 kg)  10/24/17 267 lb 1.9 oz (121.2 kg)     GEN: Patient is in no acute distress HEENT: Normal NECK: No JVD; No carotid bruits LYMPHATICS: No lymphadenopathy CARDIAC: Hear sounds regular, 2/6 systolic murmur at the apex. RESPIRATORY:  Clear to auscultation without rales, wheezing or rhonchi  ABDOMEN: Soft, non-tender, non-distended MUSCULOSKELETAL:  No edema; No deformity  SKIN: Warm and dry NEUROLOGIC:  Alert and oriented x 3 PSYCHIATRIC:  Normal affect   Signed, Garwin Brothers, MD  10/14/2021 3:24 PM    [facility]

## 2021-10-22 LAB — CBC WITH DIFFERENTIAL/PLATELET
Basophils Absolute: 0.1 10*3/uL (ref 0.0–0.2)
Basos: 1 %
EOS (ABSOLUTE): 0.2 10*3/uL (ref 0.0–0.4)
Eos: 3 %
Hematocrit: 37.8 % (ref 34.0–46.6)
Hemoglobin: 12.9 g/dL (ref 11.1–15.9)
Immature Grans (Abs): 0 10*3/uL (ref 0.0–0.1)
Immature Granulocytes: 0 %
Lymphocytes Absolute: 2 10*3/uL (ref 0.7–3.1)
Lymphs: 29 %
MCH: 29.7 pg (ref 26.6–33.0)
MCHC: 34.1 g/dL (ref 31.5–35.7)
MCV: 87 fL (ref 79–97)
Monocytes Absolute: 0.4 10*3/uL (ref 0.1–0.9)
Monocytes: 5 %
Neutrophils Absolute: 4.3 10*3/uL (ref 1.4–7.0)
Neutrophils: 62 %
Platelets: 335 10*3/uL (ref 150–450)
RBC: 4.34 x10E6/uL (ref 3.77–5.28)
RDW: 12.8 % (ref 11.7–15.4)
WBC: 7 10*3/uL (ref 3.4–10.8)

## 2021-10-22 LAB — BASIC METABOLIC PANEL
BUN/Creatinine Ratio: 11 (ref 9–23)
BUN: 12 mg/dL (ref 6–24)
CO2: 22 mmol/L (ref 20–29)
Calcium: 10.2 mg/dL (ref 8.7–10.2)
Chloride: 100 mmol/L (ref 96–106)
Creatinine, Ser: 1.05 mg/dL — ABNORMAL HIGH (ref 0.57–1.00)
Glucose: 99 mg/dL (ref 70–99)
Potassium: 4.7 mmol/L (ref 3.5–5.2)
Sodium: 137 mmol/L (ref 134–144)
eGFR: 69 mL/min/{1.73_m2} (ref >59)

## 2021-10-22 LAB — LIPID PANEL
Chol/HDL Ratio: 3.9 ratio (ref 0.0–4.4)
Cholesterol, Total: 173 mg/dL (ref 100–199)
HDL: 44 mg/dL (ref >39)
LDL Chol Calc (NIH): 103 mg/dL — ABNORMAL HIGH (ref 0–99)
Triglycerides: 150 mg/dL — ABNORMAL HIGH (ref 0–149)
VLDL Cholesterol Cal: 26 mg/dL (ref 5–40)

## 2021-10-22 LAB — HEPATIC FUNCTION PANEL
ALT: 24 IU/L (ref 0–32)
AST: 22 IU/L (ref 0–40)
Albumin: 4.4 g/dL (ref 3.8–4.8)
Alkaline Phosphatase: 79 IU/L (ref 44–121)
Bilirubin Total: 0.7 mg/dL (ref 0.0–1.2)
Bilirubin, Direct: 0.18 mg/dL (ref 0.00–0.40)
Total Protein: 6.9 g/dL (ref 6.0–8.5)

## 2021-10-22 LAB — TSH: TSH: 5.29 u[IU]/mL — ABNORMAL HIGH (ref 0.450–4.500)

## 2021-10-22 MED ORDER — DILTIAZEM HCL ER BEADS 120 MG PO CP24: 120.0000 mg | ORAL_CAPSULE | Freq: Every day | ORAL | 3 refills | Status: DC

## 2021-10-22 MED ORDER — LOSARTAN POTASSIUM 25 MG PO TABS: 25.0000 mg | ORAL_TABLET | Freq: Every day | ORAL | 3 refills | Status: DC

## 2021-10-22 NOTE — Addendum Note (Signed)
Addended by: Truddie Hidden on: 10/22/2021 01:46 PM   Modules accepted: Orders

## 2021-10-27 ENCOUNTER — Encounter: Payer: Self-pay | Admitting: Cardiology

## 2021-11-05 ENCOUNTER — Ambulatory Visit: Payer: BC Managed Care – PPO | Admitting: Cardiology

## 2021-11-06 ENCOUNTER — Other Ambulatory Visit: Payer: Self-pay | Admitting: Cardiology

## 2021-11-08 NOTE — Telephone Encounter (Signed)
Rx refill sent to pharmacy. 

## 2022-02-21 ENCOUNTER — Encounter: Payer: Self-pay | Admitting: Nurse Practitioner

## 2022-02-24 ENCOUNTER — Encounter: Payer: Self-pay | Admitting: Nurse Practitioner

## 2022-03-03 ENCOUNTER — Encounter: Payer: Self-pay | Admitting: Nurse Practitioner

## 2022-03-03 ENCOUNTER — Ambulatory Visit (INDEPENDENT_AMBULATORY_CARE_PROVIDER_SITE_OTHER): Payer: BC Managed Care – PPO | Admitting: Nurse Practitioner

## 2022-03-03 VITALS — BP 138/68 | HR 70 | Resp 16 | Ht 66.5 in | Wt 282.0 lb

## 2022-03-03 DIAGNOSIS — N920 Excessive and frequent menstruation with regular cycle: Secondary | ICD-10-CM

## 2022-03-03 DIAGNOSIS — Z01419 Encounter for gynecological examination (general) (routine) without abnormal findings: Secondary | ICD-10-CM | POA: Diagnosis not present

## 2022-03-03 NOTE — Progress Notes (Signed)
Genessa Beman Jun 24, 1980 [id number]   History:  41 y.o. G0 presents as new patient to establish care. Monthly cycles. Has always had very heavy, painful menses. Has considered surgery. Has not tried hormonal contraception. Normal CBC in May, reports no history of anemia. Normal pap history. H/O abnormal TSH managed by endocrinology, not on hormones at this time. HLD, HTN, palpations, managed by cardiology. Seeing bariatrics for weight management.   Gynecologic History Patient's last menstrual period was 02/23/2022. Period Cycle (Days): 30 Period Duration (Days): 5-7 Period Pattern: Regular Menstrual Flow: Heavy Menstrual Control: Tampon Menstrual Control Change Freq (Hours): changes ultra tampon 3 times a day Dysmenorrhea: (!) Severe Dysmenorrhea Symptoms: Cramping, Throbbing, Nausea, Diarrhea, Headache Contraception/Family planning: none Sexually active: Yes, same sex partner  Health Maintenance Last Pap: 04/15/2019. Results were: Normal neg HPV Last mammogram: 02/21/22. Results were: Right breast asymmetry, cluster of masses. Ultrasound 10/3 - benign cyst Last colonoscopy: Not indicated Last Dexa: Not indicated   Past medical history, past surgical history, family history and social history were all reviewed and documented in the EPIC chart. Starting new job in [employer] education [employer] next week.   ROS:  A ROS was performed and pertinent positives and negatives are included.  Exam:  Vitals:   03/03/22 0849  BP: 138/68  Pulse: 70  Resp: 16  Weight: 282 lb (127.9 kg)  Height: 5' 6.5" (1.689 m)   Body mass index is 44.83 kg/m.  General appearance:  Normal Thyroid:  Symmetrical, normal in size, without palpable masses or nodularity. Respiratory  Auscultation:  Clear without wheezing or rhonchi Cardiovascular  Auscultation:  Regular rate, without rubs, murmurs or gallops  Edema/varicosities:  Not grossly evident Abdominal  Soft,nontender, without masses, guarding or  rebound.  Liver/spleen:  No organomegaly noted  Hernia:  None appreciated  Skin  Inspection:  Grossly normal Breasts: Examined lying and sitting.   Right: Without masses, retractions, nipple discharge or axillary adenopathy.   Left: Without masses, retractions, nipple discharge or axillary adenopathy. Genitourinary   Inguinal/mons:  Normal without inguinal adenopathy  External genitalia:  Normal appearing vulva with no masses, tenderness, or lesions  BUS/Urethra/Skene's glands:  Normal  Vagina:  Normal appearing with normal color and discharge, no lesions  Cervix:  Normal appearing without discharge or lesions  Uterus:  Difficult to palpate due to body habitus but no gross masses or tenderness  Adnexa/parametria:     Rt: Normal in size, without masses or tenderness.   Lt: Normal in size, without masses or tenderness.  Anus and perineum: Normal  Digital rectal exam: Not indicated  Patient informed chaperone available to be present for breast and pelvic exam. Patient has requested no chaperone to be present. Patient has been advised what will be completed during breast and pelvic exam.   Assessment/Plan:  41 y.o. G0 to establish care.   Well female exam with routine gynecological exam - Education provided on SBEs, importance of preventative screenings, current guidelines, high calcium diet, regular exercise, and multivitamin daily. Labs done elsewhere.   Menorrhagia with regular cycle - H/O heavy, painful menses. Occur monthly. Has been thinking about surgery. Has not tried hormonal contraception. We discussed management with progestin-only contraception and she will consider this.   Screening for cervical cancer - Normal Pap history.  Will repeat at 5-year interval per guidelines.  Screening for breast cancer - Benign right cyst 03/01/22. Continue annual screenings.  Normal breast exam today.  Return in 1 year for annual.      Kenyanna Grzesiak A Earlene Plater  DNP, 9:19 AM 03/03/2022

## 2022-06-02 ENCOUNTER — Ambulatory Visit
Admission: RE | Admit: 2022-06-02 | Discharge: 2022-06-02 | Disposition: A | Discharge status: Home / Self Care | Payer: BC Managed Care – PPO | Source: Ambulatory Visit | Attending: Family Medicine | Admitting: Family Medicine

## 2022-06-02 ENCOUNTER — Other Ambulatory Visit: Payer: Self-pay | Admitting: Family Medicine

## 2022-06-02 ENCOUNTER — Ambulatory Visit
Admission: RE | Admit: 2022-06-02 | Discharge: 2022-06-02 | Disposition: A | Discharge status: Home / Self Care | Payer: BC Managed Care – PPO | Attending: Family Medicine | Admitting: Family Medicine

## 2022-06-02 DIAGNOSIS — R079 Chest pain, unspecified: Secondary | ICD-10-CM | POA: Insufficient documentation

## 2022-10-27 ENCOUNTER — Other Ambulatory Visit: Payer: Self-pay | Admitting: Cardiology

## 2022-11-02 ENCOUNTER — Other Ambulatory Visit: Payer: Self-pay | Admitting: Cardiology

## 2022-12-06 ENCOUNTER — Other Ambulatory Visit: Payer: Self-pay | Admitting: Cardiology

## 2022-12-10 ENCOUNTER — Other Ambulatory Visit: Payer: Self-pay | Admitting: Cardiology

## 2022-12-28 ENCOUNTER — Other Ambulatory Visit: Payer: Self-pay | Admitting: Cardiology

## 2023-01-10 ENCOUNTER — Other Ambulatory Visit: Payer: Self-pay | Admitting: Cardiology

## 2023-03-03 ENCOUNTER — Encounter: Payer: Self-pay | Admitting: Nurse Practitioner

## 2023-03-06 ENCOUNTER — Ambulatory Visit: Payer: BC Managed Care – PPO | Admitting: Nurse Practitioner

## 2023-04-09 ENCOUNTER — Other Ambulatory Visit: Payer: Self-pay | Admitting: Cardiology

## 2023-04-18 ENCOUNTER — Ambulatory Visit: Payer: BC Managed Care – PPO | Admitting: Cardiology

## 2023-06-07 ENCOUNTER — Other Ambulatory Visit: Payer: Self-pay

## 2023-06-08 ENCOUNTER — Encounter: Payer: Self-pay | Admitting: Nurse Practitioner

## 2023-06-08 ENCOUNTER — Encounter: Payer: Self-pay | Admitting: Cardiology

## 2023-06-08 ENCOUNTER — Ambulatory Visit (INDEPENDENT_AMBULATORY_CARE_PROVIDER_SITE_OTHER): Payer: 59 | Admitting: Nurse Practitioner

## 2023-06-08 ENCOUNTER — Ambulatory Visit: Payer: 59 | Attending: Cardiology | Admitting: Cardiology

## 2023-06-08 VITALS — BP 126/88 | HR 87 | Ht 66.0 in | Wt 267.1 lb

## 2023-06-08 VITALS — BP 112/84 | HR 91 | Ht 66.75 in | Wt 268.0 lb

## 2023-06-08 DIAGNOSIS — I10 Essential (primary) hypertension: Secondary | ICD-10-CM | POA: Diagnosis not present

## 2023-06-08 DIAGNOSIS — R61 Generalized hyperhidrosis: Secondary | ICD-10-CM

## 2023-06-08 DIAGNOSIS — Z01419 Encounter for gynecological examination (general) (routine) without abnormal findings: Secondary | ICD-10-CM | POA: Diagnosis not present

## 2023-06-08 DIAGNOSIS — E781 Pure hyperglyceridemia: Secondary | ICD-10-CM

## 2023-06-08 DIAGNOSIS — Z6841 Body Mass Index (BMI) 40.0 and over, adult: Secondary | ICD-10-CM

## 2023-06-08 DIAGNOSIS — L659 Nonscarring hair loss, unspecified: Secondary | ICD-10-CM

## 2023-06-08 DIAGNOSIS — F331 Major depressive disorder, recurrent, moderate: Secondary | ICD-10-CM

## 2023-06-08 NOTE — Patient Instructions (Signed)

## 2023-06-08 NOTE — Progress Notes (Signed)
 Becky Tate 1981/04/30 [id number]   History:  43 y.o. G0 presents for annual exam. Monthly cycles. Has always had very heavy, painful menses. Has considered surgery. Has not tried hormonal contraception. Reports no history of anemia. Normal pap history. H/O abnormal TSH managed by endocrinology, not on hormones at this time. HLD, HTN, palpations, managed by cardiology. Has questions about perimenopause/menopause. Complains of sweating in buttocks more lately. Always hot but this is not new for her. EYV0-83 - on zoloft, she is unsure who prescribes. Sees ADHD specialist. Has noticed frontal hair loss for a few years. Saw dermatology in [place] in the past, would like to establish with one here.  Gynecologic History Patient's last menstrual period was 05/24/2023 (approximate). Period Cycle (Days):  (28-30) Period Duration (Days): 5-7 Menstrual Flow: Heavy Menstrual Control: Tampon Dysmenorrhea: (!) Moderate Dysmenorrhea Symptoms: Cramping, Nausea, Diarrhea, Headache Contraception/Family planning: none Sexually active: Yes, same sex partner  Health Maintenance Last Pap: 04/15/2019. Results were: Normal neg HPV Last mammogram: 02/21/22. Results were: Right breast asymmetry, cluster of masses. Ultrasound 10/3 - benign cyst Last colonoscopy: Not indicated Last Dexa: Not indicated   Past medical history, past surgical history, family history and social history were all reviewed and documented in the EPIC chart. Works remote for [employer].   ROS:  A ROS was performed and pertinent positives and negatives are included.  Exam:  Vitals:   06/08/23 1052  BP: 112/84  Pulse: 91  SpO2: 95%  Weight: 268 lb (121.6 kg)  Height: 5' 6.75 (1.695 m)    Body mass index is 42.29 kg/m.  General appearance:  Normal Thyroid :  Symmetrical, normal in size, without palpable masses or nodularity. Respiratory  Auscultation:  Clear without wheezing or rhonchi Cardiovascular  Auscultation:  Regular rate,  without rubs, murmurs or gallops  Edema/varicosities:  Not grossly evident Abdominal  Soft,nontender, without masses, guarding or rebound.  Liver/spleen:  No organomegaly noted  Hernia:  None appreciated  Skin  Inspection:  Grossly normal Breasts: Examined lying and sitting.   Right: Without masses, retractions, nipple discharge or axillary adenopathy.   Left: Without masses, retractions, nipple discharge or axillary adenopathy. Pelvic: External genitalia:  no lesions              Urethra:  normal appearing urethra with no masses, tenderness or lesions              Bartholins and Skenes: normal                 Vagina: normal appearing vagina with normal color and discharge, no lesions              Cervix: no lesions Bimanual Exam:  Uterus:  no masses or tenderness              Adnexa: no mass, fullness, tenderness              Rectovaginal: Deferred              Anus:  normal, no lesions  Patient informed chaperone available to be present for breast and pelvic exam. Patient has requested no chaperone to be present. Patient has been advised what will be completed during breast and pelvic exam.   Assessment/Plan:  43 y.o. G0 to establish care.   Well female exam with routine gynecological exam - Plan: CBC with Differential/Platelet, Comprehensive metabolic panel. Education provided on SBEs, importance of preventative screenings, current guidelines, high calcium diet, regular exercise, and multivitamin daily.   Hair loss -  Plan: TSH, Ambulatory referral to Dermatology. Frontal, near hairline for a few years. Saw derm in the past but needs one local. Sees endocrinology for hypothyroidism, TSH normal for over a year. Last checked in May 2024, not on hormones.   BMI 40.0-44.9, adult (HCC) - Plan: Lipid panel, Hemoglobin A1c  Excessive sweating - Plan: TSH, Ambulatory referral to Dermatology  Moderate episode of recurrent major depressive disorder (HCC) - on Zoloft. Recommend discussing  management with prescriber. EYV0-83.   Screening for cervical cancer - Normal Pap history.  Will repeat at 5-year interval per guidelines.  Screening for breast cancer - Benign right cyst 03/01/22. Overdue and encouraged to schedule. Normal breast exam today.  Return in about 1 year (around 06/07/2024) for Annual.     Becky DELENA Shutter DNP, 11:43 AM 06/08/2023

## 2023-06-08 NOTE — Progress Notes (Signed)
 Cardiology Office Note:    Date:  06/08/2023   ID:  Becky Tate, DOB June 28, 1980, MRN [id number]  PCP:  Pcp, No  Cardiologist:  Jennifer JONELLE Crape, MD   Referring MD: No ref. provider found    ASSESSMENT:    1. Hypertriglyceridemia   2. Essential hypertension   3. Morbid obesity (HCC)    PLAN:    In order of problems listed above:  Primary prevention stressed with the patient.  Importance of compliance with diet medication stressed and patient verbalized standing. She was advised to walk at least half an hour a day 5 days a week and she promises to do so. Essential hypertension: Blood pressure is stable and diet was emphasized.  I checked with the send she is on ARB to see if there is any chance of she being pregnant or will be pregnant in the future she mentions to me that there is absolutely no chance that she can be pregnant now or in the future.  She tells me that if anything changes she will get back to us .  I discussed with her the very significant adverse effects of ARB's on the fetus in pregnancy and she vocalized understanding and questions were answered to her satisfaction. Hypertriglyceridemia: Diet emphasized.  She had blood work by primary care and we will review this. Morbid obesity: Diet emphasized.  Risks of obesity explained and she promises to do better. Patient will be seen in follow-up appointment in 12 months or earlier if the patient has any concerns.    Medication Adjustments/Labs and Tests Ordered: Current medicines are reviewed at length with the patient today.  Concerns regarding medicines are outlined above.  Orders Placed This Encounter  Procedures   EKG 12-Lead   No orders of the defined types were placed in this encounter.    No chief complaint on file.    History of Present Illness:    Becky Tate is a 43 y.o. female.  Patient has past medical history of essential hypertension, hypertriglyceridemia and morbid obesity.  She denies any  problems at this time and takes care of activities of daily living.  Chest pain orthopnea or PND.  At the time of my evaluation, the patient is alert awake oriented and in no distress.  She leads a sedentary lifestyle.  Past Medical History:  Diagnosis Date   Acne    Ankle pain    Anxiety    BMI 38.0-38.9,adult 03/16/2018   Chest pain of uncertain etiology 10/24/2017   Encounter for screening for other metabolic disorders 10/12/2021   Essential hypertension 06/24/2020   H/O seasonal allergies    Hypertriglyceridemia 03/16/2018   Metabolic syndrome 03/16/2018   Morbid obesity (HCC) 06/24/2020   Osteoarthritis of knee 07/25/2016   Osteochondritis dissecans 07/25/2016   Palpitations 06/24/2020    Past Surgical History:  Procedure Laterality Date   ANKLE SURGERY     BREAST REDUCTION SURGERY Bilateral 04/2019    Current Medications: Current Meds  Medication Sig   fexofenadine (ALLEGRA) 180 MG tablet Take 180 mg by mouth daily.   lisdexamfetamine (VYVANSE) 40 MG capsule    LORazepam 1 MG/0.5ML CONC    losartan  (COZAAR ) 25 MG tablet Take 1 tablet (25 mg total) by mouth daily. Final attempt, patient needs and appt for additional refills   sertraline (ZOLOFT) 25 MG tablet Take 25 mg by mouth daily.     Allergies:   Oxycodone-acetaminophen , Sulfa antibiotics, and Penicillin g   Social History   Socioeconomic History  Marital status: Single    Spouse name: Not on file   Number of children: Not on file   Years of education: Not on file   Highest education level: Not on file  Occupational History   Not on file  Tobacco Use   Smoking status: Never    Passive exposure: Past   Smokeless tobacco: Never  Vaping Use   Vaping status: Never Used  Substance and Sexual Activity   Alcohol use: Yes    Alcohol/week: 3.0 standard drinks of alcohol    Types: 3 Standard drinks or equivalent per week    Comment: occ   Drug use: Yes    Comment: Gummies   Sexual activity: Yes    Partners:  Female    Birth control/protection: None    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  ROS:   Please see the history of present illness.    All other systems reviewed and are negative.  EKGs/Labs/Other Studies Reviewed:    The following studies were reviewed today: .SABRAEKG Interpretation Date/Time:  Thursday June 08 2023 13:32:07 EST Ventricular Rate:  87 PR Interval:  154 QRS Duration:  78 QT Interval:  390 QTC Calculation: 469 R Axis:   61  Text Interpretation: Normal sinus rhythm Normal ECG No previous ECGs available Confirmed by Edwyna Backers [phone]) on 06/08/2023 1:51:07 PM     Recent Labs: No results found for requested labs within last 365 days.  Recent Lipid Panel    Component Value Date/Time   CHOL 173 10/21/2021 1041   TRIG 150 (H) 10/21/2021 1041   HDL 44 10/21/2021 1041   CHOLHDL 3.9 10/21/2021 1041   LDLCALC 103 (H) 10/21/2021 1041    Physical Exam:    VS:  BP 126/88   Pulse 87   Ht 5' 6 (1.676 m)   Wt 267 lb 1.3 oz (121.1 kg)   LMP 05/24/2023 (Approximate)   SpO2 96%   BMI 43.11 kg/m     Wt Readings from Last 3 Encounters:  06/08/23 267 lb 1.3 oz (121.1 kg)  06/08/23 268 lb (121.6 kg)  03/03/22 282 lb (127.9 kg)     GEN: Patient is in no acute distress HEENT: Normal NECK: No JVD; No carotid bruits LYMPHATICS: No lymphadenopathy CARDIAC: Hear sounds regular, 2/6 systolic murmur at the apex. RESPIRATORY:  Clear  to auscultation without rales, wheezing or rhonchi  ABDOMEN: Soft, non-tender, non-distended MUSCULOSKELETAL:  No edema; No deformity  SKIN: Warm and dry NEUROLOGIC:  Alert and oriented x 3 PSYCHIATRIC:  Normal affect   Signed, Backers JONELLE Edwyna, MD  06/08/2023 2:00 PM    [facility]

## 2023-06-09 LAB — COMPREHENSIVE METABOLIC PANEL
AG Ratio: 1.5 (calc) (ref 1.0–2.5)
ALT: 19 U/L (ref 6–29)
AST: 19 U/L (ref 10–30)
Albumin: 4.4 g/dL (ref 3.6–5.1)
Alkaline phosphatase (APISO): 74 U/L (ref 31–125)
BUN: 13 mg/dL (ref 7–25)
CO2: 22 mmol/L (ref 20–32)
Calcium: 10 mg/dL (ref 8.6–10.2)
Chloride: 100 mmol/L (ref 98–110)
Creat: 0.99 mg/dL (ref 0.50–0.99)
Globulin: 3 g/dL (ref 1.9–3.7)
Glucose, Bld: 95 mg/dL (ref 65–99)
Potassium: 4.7 mmol/L (ref 3.5–5.3)
Sodium: 138 mmol/L (ref 135–146)
Total Bilirubin: 1 mg/dL (ref 0.2–1.2)
Total Protein: 7.4 g/dL (ref 6.1–8.1)

## 2023-06-09 LAB — CBC WITH DIFFERENTIAL/PLATELET
Absolute Lymphocytes: 1778 {cells}/uL (ref 850–3900)
Absolute Monocytes: 342 {cells}/uL (ref 200–950)
Basophils Absolute: 68 {cells}/uL (ref 0–200)
Basophils Relative: 1.2 %
Eosinophils Absolute: 131 {cells}/uL (ref 15–500)
Eosinophils Relative: 2.3 %
HCT: 41.2 % (ref 35.0–45.0)
Hemoglobin: 13.5 g/dL (ref 11.7–15.5)
MCH: 29.5 pg (ref 27.0–33.0)
MCHC: 32.8 g/dL (ref 32.0–36.0)
MCV: 90.2 fL (ref 80.0–100.0)
MPV: 12.1 fL (ref 7.5–12.5)
Monocytes Relative: 6 %
Neutro Abs: 3380 {cells}/uL (ref 1500–7800)
Neutrophils Relative %: 59.3 %
Platelets: 342 10*3/uL (ref 140–400)
RBC: 4.57 10*6/uL (ref 3.80–5.10)
RDW: 12.4 % (ref 11.0–15.0)
Total Lymphocyte: 31.2 %
WBC: 5.7 10*3/uL (ref 3.8–10.8)

## 2023-06-09 LAB — LIPID PANEL
Cholesterol: 193 mg/dL (ref <200)
HDL: 49 mg/dL — ABNORMAL LOW (ref >=50)
LDL Cholesterol (Calc): 119 mg/dL — ABNORMAL HIGH
Non-HDL Cholesterol (Calc): 144 mg/dL — ABNORMAL HIGH (ref <130)
Total CHOL/HDL Ratio: 3.9 (calc) (ref <5.0)
Triglycerides: 142 mg/dL (ref <150)

## 2023-06-09 LAB — HEMOGLOBIN A1C
Hgb A1c MFr Bld: 5.4 %{Hb} (ref <5.7)
Mean Plasma Glucose: 108 mg/dL
eAG (mmol/L): 6 mmol/L

## 2023-06-09 LAB — TSH: TSH: 3.58 m[IU]/L

## 2023-06-20 ENCOUNTER — Other Ambulatory Visit: Payer: Self-pay | Admitting: Cardiology

## 2023-06-21 ENCOUNTER — Encounter: Payer: Self-pay | Admitting: Nurse Practitioner

## 2023-06-21 MED ORDER — LOSARTAN POTASSIUM 25 MG PO TABS: 25.0000 mg | ORAL_TABLET | Freq: Every day | ORAL | 11 refills | Status: DC

## 2023-06-21 NOTE — Telephone Encounter (Signed)
Rx refill sent to pharmacy. 

## 2024-06-10 ENCOUNTER — Ambulatory Visit: Payer: 59 | Admitting: Nurse Practitioner

## 2024-06-23 ENCOUNTER — Other Ambulatory Visit: Payer: Self-pay | Admitting: Cardiology

## 2024-06-25 NOTE — Telephone Encounter (Signed)
 Patient needs appointment for future refills

## 2024-07-10 ENCOUNTER — Ambulatory Visit: Admitting: Nurse Practitioner
